# Patient Record
Sex: Male | Born: 1985 | Race: Black or African American | Hispanic: No | Marital: Single | Smoking: Current every day smoker
Health system: Southern US, Community
[De-identification: ages and names within clinical notes are randomized; demographics above are authoritative.]

## PROBLEM LIST (undated history)

## (undated) DIAGNOSIS — J45909 Unspecified asthma, uncomplicated: Secondary | ICD-10-CM

## (undated) HISTORY — PX: BELOW KNEE LEG AMPUTATION: SUR23

---

## 2013-11-25 ENCOUNTER — Emergency Department (HOSPITAL_COMMUNITY): Payer: Medicaid Other

## 2013-11-25 ENCOUNTER — Encounter (HOSPITAL_COMMUNITY): Payer: Self-pay | Admitting: Emergency Medicine

## 2013-11-25 ENCOUNTER — Emergency Department (HOSPITAL_COMMUNITY)
Admission: EM | Admit: 2013-11-25 | Discharge: 2013-11-25 | Disposition: A | Discharge status: Home / Self Care | Payer: Medicaid Other | Attending: Emergency Medicine | Admitting: Emergency Medicine

## 2013-11-25 DIAGNOSIS — Y939 Activity, unspecified: Secondary | ICD-10-CM | POA: Insufficient documentation

## 2013-11-25 DIAGNOSIS — S99929A Unspecified injury of unspecified foot, initial encounter: Secondary | ICD-10-CM | POA: Diagnosis not present

## 2013-11-25 DIAGNOSIS — M25561 Pain in right knee: Secondary | ICD-10-CM

## 2013-11-25 DIAGNOSIS — Z880 Allergy status to penicillin: Secondary | ICD-10-CM | POA: Diagnosis not present

## 2013-11-25 DIAGNOSIS — S8990XA Unspecified injury of unspecified lower leg, initial encounter: Secondary | ICD-10-CM | POA: Insufficient documentation

## 2013-11-25 DIAGNOSIS — W19XXXA Unspecified fall, initial encounter: Secondary | ICD-10-CM | POA: Insufficient documentation

## 2013-11-25 DIAGNOSIS — F17200 Nicotine dependence, unspecified, uncomplicated: Secondary | ICD-10-CM | POA: Diagnosis not present

## 2013-11-25 DIAGNOSIS — Y929 Unspecified place or not applicable: Secondary | ICD-10-CM | POA: Diagnosis not present

## 2013-11-25 DIAGNOSIS — S99919A Unspecified injury of unspecified ankle, initial encounter: Principal | ICD-10-CM

## 2013-11-25 MED ORDER — HYDROCODONE-ACETAMINOPHEN 5-325 MG PO TABS: 1.0000 | ORAL_TABLET | Freq: Four times a day (QID) | ORAL | Status: DC | PRN

## 2013-11-25 MED ORDER — ONDANSETRON 4 MG PO TBDP
8.0000 mg | ORAL_TABLET | Freq: Once | ORAL | Status: AC
  Administered 2013-11-25: 8 mg via ORAL
  Filled 2013-11-25: qty 2

## 2013-11-25 MED ORDER — HYDROCODONE-ACETAMINOPHEN 5-325 MG PO TABS
2.0000 | ORAL_TABLET | Freq: Once | ORAL | Status: AC
  Administered 2013-11-25: 2 via ORAL
  Filled 2013-11-25: qty 2

## 2013-11-25 MED ORDER — ONDANSETRON 4 MG PO TBDP: 4.0000 mg | ORAL_TABLET | Freq: Three times a day (TID) | ORAL | Status: DC | PRN

## 2013-11-25 NOTE — ED Notes (Signed)
Patient transported to X-ray 

## 2013-11-25 NOTE — ED Notes (Signed)
Pt presents to department for evaluation of R leg pain. States he fell Thursday night. States he has issues with leg "locking" in place. 8/10 pain upon arrival to ED. 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 NAD.

## 2013-11-25 NOTE — ED Provider Notes (Signed)
Medical screening examination/treatment/procedure(s) were performed by non-physician practitioner and as supervising physician I was immediately available for consultation/collaboration.   EKG Interpretation None        Courtney F Horton, MD 11/25/13 1942 

## 2013-11-25 NOTE — ED Notes (Signed)
Patient declines wheelchair at discharge.  Patient escorted to lobby by RN.  

## 2013-11-25 NOTE — ED Provider Notes (Signed)
CSN: [id number]     Arrival date & time 11/25/13  1144 History  This chart was scribed for non-physician practitioner working with Shon Baton, MD by Freida Busman, ED Scribe. This patient was seen in room TR05C/TR05C and the patient's care was started at 12:51 PM.    Chief Complaint  Patient presents with  . Leg Pain    The history is provided by the patient. No language interpreter was used.    HPI Comments:  Joseph Bond is a 28 y.o. male who presents to the Emergency Department s/p fall 4 days ago complaining of moderate pain to his right leg following the incident. He denies head injury or LOC s/p fall. Pt states his leg has been "locking" up.  He denies numbness and tingling down his RLE. He also denies  increased warmth/cold or redness to the extremity. No fever/chils. No alleviating factors noted.  Pt with a h/o a left BKA.   History reviewed. No pertinent past medical history. Past Surgical History  Procedure Laterality Date  . Below knee leg amputation     No family history on file. History  Substance Use Topics  . Smoking status: Current Every Day Smoker    Types: Cigarettes  . Smokeless tobacco: Not on file  . Alcohol Use: Yes     Comment: social    Review of Systems  Musculoskeletal: Positive for arthralgias and myalgias.  All other systems reviewed and are negative.     Allergies  Penicillins  Home Medications   Prior to Admission medications   Medication Sig Start Date End Date Taking? Authorizing Provider  HYDROcodone-acetaminophen (NORCO/VICODIN) 5-325 MG per tablet Take 1-2 tablets by mouth every 6 (six) hours as needed for severe pain. 11/25/13   Niaja Stickley L Adeana Grilliot, PA-C  ondansetron (ZOFRAN ODT) 4 MG disintegrating tablet Take 1 tablet (4 mg total) by mouth every 8 (eight) hours as needed for nausea or vomiting. 11/25/13   Victorino Dike L Veretta Sabourin, PA-C   BP 114/66  Pulse 76  Temp(Src) 97.4 F (36.3 C) (Oral)  Resp 16  SpO2 100% Physical  Exam  Nursing note and vitals reviewed. Constitutional: He is oriented to person, place, and time. He appears well-developed and well-nourished. No distress.  HENT:  Head: Normocephalic and atraumatic.  Right Ear: External ear normal.  Left Ear: External ear normal.  Nose: Nose normal.  Mouth/Throat: Oropharynx is clear and moist.  Eyes: Conjunctivae are normal.  Neck: Normal range of motion. Neck supple.  Cardiovascular: Normal rate, regular rhythm, normal heart sounds and intact distal pulses.   Pulmonary/Chest: Effort normal and breath sounds normal. No respiratory distress.  Abdominal: Soft.  Musculoskeletal:  Lower extremity ROM at baseline Patient with left BKA. Right leg thin with contractures of toes. Decreased ROM of R knee (baseline) along with bony prominence of R knee.  No erythema, warmth, effusion, bruising noted.   Neurological: He is alert and oriented to person, place, and time.  Skin: Skin is warm and dry. No rash noted. He is not diaphoretic. No erythema.  Psychiatric: He has a normal mood and affect.    ED Course  Procedures  Medications  HYDROcodone-acetaminophen (NORCO/VICODIN) 5-325 MG per tablet 2 tablet (2 tablets Oral Given 11/25/13 1300)  ondansetron (ZOFRAN-ODT) disintegrating tablet 8 mg (8 mg Oral Given 11/25/13 1436)     DIAGNOSTIC STUDIES:  Oxygen Saturation is 100% on RA, normal by my interpretation.    COORDINATION OF CARE:  12:57 PM Discussed treatment plan with pt at  bedside and pt agreed to plan. 2:53 PM Pt reassessed states he feels better at this time.  Labs Review Labs Reviewed - No data to display  Imaging Review Dg Knee Complete 4 Views Right  11/25/2013   CLINICAL DATA:  Right knee pain after fall.  EXAM: RIGHT KNEE - COMPLETE 4+ VIEW  COMPARISON:  None.  FINDINGS: No fracture or dislocation is noted. No joint effusion is noted. Severe narrowing of the medial and lateral joint spaces is noted. No soft tissue abnormality is noted.   IMPRESSION: Severe degenerative joint disease is noted. No fracture or dislocation is noted in the right knee.   Electronically Signed   By: Roque Lias M.D.   On: 11/25/2013 14:18     EKG Interpretation None      Patient with known DJD. Discussed x-ray results with him.   Patient became nauseous after pain medication, liquids, crackers and Zofran given with resolution of symptoms.  MDM   Final diagnoses:  Right knee pain    Filed Vitals:   11/25/13 1501  BP: 114/66  Pulse: 76  Temp: 97.4 F (36.3 C)  Resp: 16   Afebrile, NAD, non-toxic appearing, AAOx4.  Neurovascularly intact. Normal sensation. No evidence of compartment syndrome. Patient X-Ray negative for obvious fracture or dislocation. Pain managed in ED. Pt advised to follow up with orthopedics for evaluation of ligament or meniscal injury. Patient given brace while in ED, conservative therapy recommended and discussed. Patient will be dc home & is agreeable with above plan.     I personally performed the services described in this documentation, which was scribed in my presence. The recorded information has been reviewed and is accurate.     Jeannetta Ellis, PA-C 11/25/13 1510

## 2013-11-25 NOTE — Discharge Instructions (Signed)
Please follow up with your primary care physician in 1-2 days. If you do not have one please call the [facility] number listed above. Please follow up with Dr. Eulah Pont to schedule a follow up appointment.  Please take pain medication and/or muscle relaxants as prescribed and as needed for pain. Please do not drive on narcotic pain medication or on muscle relaxants. Please read all discharge instructions and return precautions.   Knee Pain The knee is the complex joint between your thigh and your lower leg. It is made up of bones, tendons, ligaments, and cartilage. The bones that make up the knee are:  The femur in the thigh.  The tibia and fibula in the lower leg.  The patella or kneecap riding in the groove on the lower femur. CAUSES  Knee pain is a common complaint with many causes. A few of these causes are:  Injury, such as:  A ruptured ligament or tendon injury.  Torn cartilage.  Medical conditions, such as:  Gout  Arthritis  Infections  Overuse, over training, or overdoing a physical activity. Knee pain can be minor or severe. Knee pain can accompany debilitating injury. Minor knee problems often respond well to self-care measures or get well on their own. More serious injuries may need medical intervention or even surgery. SYMPTOMS The knee is complex. Symptoms of knee problems can vary widely. Some of the problems are:  Pain with movement and weight bearing.  Swelling and tenderness.  Buckling of the knee.  Inability to straighten or extend your knee.  Your knee locks and you cannot straighten it.  Warmth and redness with pain and fever.  Deformity or dislocation of the kneecap. DIAGNOSIS  Determining what is wrong may be very straight forward such as when there is an injury. It can also be challenging because of the complexity of the knee. Tests to make a diagnosis may include:  Your caregiver taking a history and doing a physical  exam.  Routine X-rays can be used to rule out other problems. X-rays will not reveal a cartilage tear. Some injuries of the knee can be diagnosed by:  Arthroscopy a surgical technique by which a small video camera is inserted through tiny incisions on the sides of the knee. This procedure is used to examine and repair internal knee joint problems. Tiny instruments can be used during arthroscopy to repair the torn knee cartilage (meniscus).  Arthrography is a radiology technique. A contrast liquid is directly injected into the knee joint. Internal structures of the knee joint then become visible on X-ray film.  An MRI scan is a non X-ray radiology procedure in which magnetic fields and a computer produce two- or three-dimensional images of the inside of the knee. Cartilage tears are often visible using an MRI scanner. MRI scans have largely replaced arthrography in diagnosing cartilage tears of the knee.  Blood work.  Examination of the fluid that helps to lubricate the knee joint (synovial fluid). This is done by taking a sample out using a needle and a syringe. TREATMENT The treatment of knee problems depends on the cause. Some of these treatments are:  Depending on the injury, proper casting, splinting, surgery, or physical therapy care will be needed.  Give yourself adequate recovery time. Do not overuse your joints. If you begin to get sore during workout routines, back off. Slow down or do fewer repetitions.  For repetitive activities such as cycling or running, maintain your strength and nutrition.  Alternate  muscle groups. For example, if you are a weight lifter, work the upper body on one day and the lower body the next. °· Either tight or weak muscles do not give the proper support for your knee. Tight or weak muscles do not absorb the stress placed on the knee joint. Keep the muscles surrounding the knee strong. °· Take care of mechanical problems. °¨ If you have flat feet, orthotics  or special shoes may help. See your caregiver if you need help. °¨ Arch supports, sometimes with wedges on the inner or outer aspect of the heel, can help. These can shift pressure away from the side of the knee most bothered by osteoarthritis. °¨ A brace called an "unloader" brace also may be used to help ease the pressure on the most arthritic side of the knee. °· If your caregiver has prescribed crutches, braces, wraps or ice, use as directed. The acronym for this is PRICE. This means protection, rest, ice, compression, and elevation. °· Nonsteroidal anti-inflammatory drugs (NSAIDs), can help relieve pain. But if taken immediately after an injury, they may actually increase swelling. Take NSAIDs with food in your stomach. Stop them if you develop stomach problems. Do not take these if you have a history of ulcers, stomach pain, or bleeding from the bowel. Do not take without your caregiver's approval if you have problems with fluid retention, heart failure, or kidney problems. °· For ongoing knee problems, physical therapy may be helpful. °· Glucosamine and chondroitin are over-the-counter dietary supplements. Both may help relieve the pain of osteoarthritis in the knee. These medicines are different from the usual anti-inflammatory drugs. Glucosamine may decrease the rate of cartilage destruction. °· Injections of a corticosteroid drug into your knee joint may help reduce the symptoms of an arthritis flare-up. They may provide pain relief that lasts a few months. You may have to wait a few months between injections. The injections do have a small increased risk of infection, water retention, and elevated blood sugar levels. °· Hyaluronic acid injected into damaged joints may ease pain and provide lubrication. These injections may work by reducing inflammation. A series of shots may give relief for as long as 6 months. °· Topical painkillers. Applying certain ointments to your skin may help relieve the pain and  stiffness of osteoarthritis. Ask your pharmacist for suggestions. Many over the-counter products are approved for temporary relief of arthritis pain. °· In some countries, doctors often prescribe topical NSAIDs for relief of chronic conditions such as arthritis and tendinitis. A review of treatment with NSAID creams found that they worked as well as oral medications but without the serious side effects. °PREVENTION °· Maintain a healthy weight. Extra pounds put more strain on your joints. °· Get strong, stay limber. Weak muscles are a common cause of knee injuries. Stretching is important. Include flexibility exercises in your workouts. °· Be smart about exercise. If you have osteoarthritis, chronic knee pain or recurring injuries, you may need to change the way you exercise. This does not mean you have to stop being active. If your knees ache after jogging or playing basketball, consider switching to swimming, water aerobics, or other low-impact activities, at least for a few days a week. Sometimes limiting high-impact activities will provide relief. °· Make sure your shoes fit well. Choose footwear that is right for your sport. °· Protect your knees. Use the proper gear for knee-sensitive activities. Use kneepads when playing volleyball or laying carpet. Buckle your seat belt every time you drive.   Most shattered kneecaps occur in car accidents.  Rest when you are tired. SEEK MEDICAL CARE IF:  You have knee pain that is continual and does not seem to be getting better.  SEEK IMMEDIATE MEDICAL CARE IF:  Your knee joint feels hot to the touch and you have a high fever. MAKE SURE YOU:   Understand these instructions.  Will watch your condition.  Will get help right away if you are not doing well or get worse. Document Released: 12/13/2006 Document Revised: 05/10/2011 Document Reviewed: 12/13/2006 [facility] Patient Information 2015 [place], [place]. This information is not intended to replace advice given  to you by your health care provider. Make sure you discuss any questions you have with your health care provider.

## 2013-11-25 NOTE — ED Notes (Signed)
From out of town; Lorcet, ultram, percocet help with pain

## 2016-02-19 ENCOUNTER — Ambulatory Visit: Payer: Medicaid Other | Attending: Family Medicine | Admitting: Family Medicine

## 2016-02-19 ENCOUNTER — Encounter: Payer: Self-pay | Admitting: Family Medicine

## 2016-02-19 ENCOUNTER — Encounter (HOSPITAL_COMMUNITY): Payer: Self-pay

## 2016-02-19 DIAGNOSIS — F909 Attention-deficit hyperactivity disorder, unspecified type: Secondary | ICD-10-CM | POA: Diagnosis not present

## 2016-02-19 DIAGNOSIS — M1710 Unilateral primary osteoarthritis, unspecified knee: Secondary | ICD-10-CM

## 2016-02-19 DIAGNOSIS — Z0001 Encounter for general adult medical examination with abnormal findings: Secondary | ICD-10-CM | POA: Insufficient documentation

## 2016-02-19 DIAGNOSIS — Z880 Allergy status to penicillin: Secondary | ICD-10-CM | POA: Diagnosis not present

## 2016-02-19 DIAGNOSIS — Z89512 Acquired absence of left leg below knee: Secondary | ICD-10-CM | POA: Diagnosis not present

## 2016-02-19 DIAGNOSIS — X58XXXA Exposure to other specified factors, initial encounter: Secondary | ICD-10-CM | POA: Diagnosis not present

## 2016-02-19 DIAGNOSIS — S83002A Unspecified subluxation of left patella, initial encounter: Secondary | ICD-10-CM | POA: Diagnosis not present

## 2016-02-19 DIAGNOSIS — Z89519 Acquired absence of unspecified leg below knee: Secondary | ICD-10-CM | POA: Insufficient documentation

## 2016-02-19 DIAGNOSIS — F988 Other specified behavioral and emotional disorders with onset usually occurring in childhood and adolescence: Secondary | ICD-10-CM | POA: Insufficient documentation

## 2016-02-19 NOTE — Progress Notes (Signed)
Had to walk to get here so right leg aches now

## 2016-02-19 NOTE — Progress Notes (Signed)
Subjective:  Patient ID: Joseph Bond, male    DOB: Jul 08, 1985  Age: 30 y.o. MRN: [id number]  CC: Referral (prosthesis left side-below knee amputation, right leg doesn't bend) and referral to psychiatry (would like to start college and wants to go back on ADD meds)   HPI Joseph Bond is a 30 year old male patient who presents today to get established with the clinic. Medical history significant for congenital absence of the left leg, ADD. He would like to obtain a new left leg prosthesis as his current one is broken. He also requests a referral to psych as he would like to get back on his ADD medications since he will be going to college.  He has pain in his right leg and left stump as he had to walk a long distance here. Currently does not take any medications.  History reviewed. No pertinent past medical history.  Past Surgical History:  Procedure Laterality Date  . BELOW KNEE LEG AMPUTATION      Allergies  Allergen Reactions  . Penicillins       Outpatient Medications Prior to Visit  Medication Sig Dispense Refill  . HYDROcodone-acetaminophen (NORCO/VICODIN) 5-325 MG per tablet Take 1-2 tablets by mouth every 6 (six) hours as needed for severe pain. 15 tablet 0  . ondansetron (ZOFRAN ODT) 4 MG disintegrating tablet Take 1 tablet (4 mg total) by mouth every 8 (eight) hours as needed for nausea or vomiting. 20 tablet 0   No facility-administered medications prior to visit.     ROS Review of Systems  Constitutional: Negative for activity change and appetite change.  HENT: Negative for sinus pressure and sore throat.   Eyes: Negative for visual disturbance.  Respiratory: Negative for cough, chest tightness and shortness of breath.   Cardiovascular: Negative for chest pain and leg swelling.  Gastrointestinal: Negative for abdominal distention, abdominal pain, constipation and diarrhea.  Endocrine: Negative.   Genitourinary: Negative for dysuria.  Musculoskeletal:       See  hpi  Skin: Negative for rash.  Allergic/Immunologic: Negative.   Neurological: Negative for weakness, light-headedness and numbness.  Psychiatric/Behavioral: Negative for dysphoric mood and suicidal ideas.    Objective:  BP 106/66 (BP Location: Right Arm, Patient Position: Sitting, Cuff Size: Small)   Pulse 88   Temp 98.2 F (36.8 C) (Oral)   Ht 5\' 6"  (1.676 m)   Wt 123 lb 9.6 oz (56.1 kg)   SpO2 100%   BMI 19.95 kg/m   BP/Weight 02/19/2016 11/25/2013  Systolic BP 106 114  Diastolic BP 66 66  Wt. (Lbs) 123.6 -  BMI 19.95 -      Physical Exam  Constitutional: He is oriented to person, place, and time. He appears well-developed and well-nourished.  Cardiovascular: Normal rate, normal heart sounds and intact distal pulses.   No murmur heard. Pulmonary/Chest: Effort normal and breath sounds normal. He has no wheezes. He has no rales. He exhibits no tenderness.  Abdominal: Soft. Bowel sounds are normal. He exhibits no distension and no mass. There is no tenderness.  Musculoskeletal:  Right lower extremity: Inability to flex at the knee Left lower extremity: Left stump with malformed left knee.  Neurological: He is alert and oriented to person, place, and time.     Assessment & Plan:   1. Subluxation of patella, acquired, left, initial encounter stable  2. Arthrosis of knee He does not take painmedications  3. 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   Follow-up: Return in about 1 month (around 03/21/2016) for coordination of care.   Jaclyn ShaggyEnobong Amao MD

## 2016-04-21 ENCOUNTER — Ambulatory Visit (HOSPITAL_COMMUNITY): Payer: Self-pay | Admitting: Psychiatry

## 2016-08-11 ENCOUNTER — Telehealth: Payer: Self-pay | Admitting: Family Medicine

## 2016-08-11 NOTE — Telephone Encounter (Signed)
Gm  Dr Venetia NightAmao patient is calling he need a  rx for his prostate leg to sent to [facility] he had problems with his medicaid before ,  Thank you

## 2016-08-13 ENCOUNTER — Ambulatory Visit: Payer: Self-pay | Admitting: Family Medicine

## 2016-08-13 NOTE — Telephone Encounter (Signed)
Will forward to pcp

## 2016-08-16 NOTE — Telephone Encounter (Signed)
Faxed to [facility] 

## 2016-08-23 ENCOUNTER — Telehealth: Payer: Self-pay | Admitting: Family Medicine

## 2016-08-23 NOTE — Telephone Encounter (Signed)
Medical records received a request for records from [facility], PT need a scrip for a Left Leg Prothesis x1 Dx Absence of left lower leg, they inform me that PT need to have an appt with us to get scrip  since the last time was see by hid PCP was 12/17, I try to call the pt, unable to reach him since is not voice mail and no one answer the call...Marland Kitchen..Marland Kitchen

## 2017-12-12 ENCOUNTER — Encounter: Payer: Self-pay | Admitting: Family Medicine

## 2017-12-12 ENCOUNTER — Ambulatory Visit: Payer: Medicaid Other | Attending: Family Medicine | Admitting: Family Medicine

## 2017-12-12 ENCOUNTER — Ambulatory Visit (HOSPITAL_BASED_OUTPATIENT_CLINIC_OR_DEPARTMENT_OTHER): Payer: Medicaid Other | Admitting: Licensed Clinical Social Worker

## 2017-12-12 VITALS — BP 118/70 | HR 90 | Temp 98.1°F | Wt 123.0 lb

## 2017-12-12 DIAGNOSIS — Z89512 Acquired absence of left leg below knee: Secondary | ICD-10-CM | POA: Diagnosis not present

## 2017-12-12 DIAGNOSIS — Z79899 Other long term (current) drug therapy: Secondary | ICD-10-CM | POA: Insufficient documentation

## 2017-12-12 DIAGNOSIS — F988 Other specified behavioral and emotional disorders with onset usually occurring in childhood and adolescence: Secondary | ICD-10-CM

## 2017-12-12 DIAGNOSIS — Z880 Allergy status to penicillin: Secondary | ICD-10-CM | POA: Insufficient documentation

## 2017-12-12 NOTE — Patient Instructions (Signed)

## 2017-12-12 NOTE — Progress Notes (Signed)
Patient needs new prothesis for right leg  Patient would liek to discuss ADHD medication.

## 2017-12-12 NOTE — Progress Notes (Signed)
Subjective:  Patient ID: Joseph Bond, male    DOB: 03-Feb-1986  Age: 32 y.o. MRN: [id number]  CC: Establish Care   HPI Joseph Bond is a 32 year old male patient who presents today for follow-up visit.  Medical history is significant for congenital absence of the left leg, ADD. He is requesting a prescription for a left leg prosthesis and I had written him one 2 years ago however his Medicaid ran out and he moved to [place] but is back to [place] now and is requesting a new prescription. He also needs medications for ADD as he last took medications 2 years ago and will be returning to college. Seen at [facility] where he underwent therapy session and was informed he needed to see his PCP for prescription.  Prior to that he had received Remeron and Cymbalta from a psychiatry practice in town pending his appointment with [facility]. He has no additional concerns today.  History reviewed. No pertinent past medical history.  Past Surgical History:  Procedure Laterality Date  . BELOW KNEE LEG AMPUTATION      Allergies  Allergen Reactions  . Penicillins      Outpatient Medications Prior to Visit  Medication Sig Dispense Refill  . DULoxetine (CYMBALTA) 20 MG capsule TK ONE C PO  BID  1  . mirtazapine (REMERON) 15 MG tablet Take 15 mg by mouth at bedtime. Take one half tablet at bedtime     No facility-administered medications prior to visit.     ROS Review of Systems  Constitutional: Negative for activity change and appetite change.  HENT: Negative for sinus pressure and sore throat.   Eyes: Negative for visual disturbance.  Respiratory: Negative for cough, chest tightness and shortness of breath.   Cardiovascular: Negative for chest pain and leg swelling.  Gastrointestinal: Negative for abdominal distention, abdominal pain, constipation and diarrhea.  Endocrine: Negative.   Genitourinary: Negative for dysuria.  Musculoskeletal: Negative for joint swelling and myalgias.    Skin: Negative for rash.  Allergic/Immunologic: Negative.   Neurological: Negative for weakness, light-headedness and numbness.  Psychiatric/Behavioral: Negative for dysphoric mood and suicidal ideas.    Objective:  BP 118/70   Pulse 90   Temp 98.1 F (36.7 C) (Oral)   Wt 123 lb (55.8 kg)   SpO2 100%   BMI 19.85 kg/m   BP/Weight 12/12/2017 02/19/2016 11/25/2013  Systolic BP 118 106 114  Diastolic BP 70 66 66  Wt. (Lbs) 123 123.6 -  BMI 19.85 19.95 -      Physical Exam  Constitutional: He is oriented to person, place, and time. He appears well-developed and well-nourished.  Cardiovascular: Normal rate and normal heart sounds.  No murmur heard. Pulmonary/Chest: Effort normal and breath sounds normal. He has no wheezes. He has no rales. He exhibits no tenderness.  Abdominal: Soft. Bowel sounds are normal. He exhibits no distension and no mass. There is no tenderness.  Musculoskeletal:  Left prosthetic leg  Neurological: He is alert and oriented to person, place, and time.     Assessment & Plan:   1. 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 Attention deficit disorder (ADD) without hyperactivity He has been off medications for the last 2 weeks Seen at [facility] but apparently did not receive prescriptions and was told to follow-up with his primary care physician - Ambulatory referral to Psychiatry   No orders of the defined types were placed in this encounter.  Follow-up: Return in about 6 months (around 06/13/2018) for Follow-up of chronic medical conditions.   Hoy Register MD

## 2017-12-13 LAB — BASIC METABOLIC PANEL
BUN/Creatinine Ratio: 12 (ref 9–20)
BUN: 12 mg/dL (ref 6–20)
CALCIUM: 9.8 mg/dL (ref 8.7–10.2)
CHLORIDE: 102 mmol/L (ref 96–106)
CO2: 23 mmol/L (ref 20–29)
CREATININE: 1.04 mg/dL (ref 0.76–1.27)
GFR calc Af Amer: 110 mL/min/{1.73_m2} (ref >59)
GFR calc non Af Amer: 95 mL/min/{1.73_m2} (ref >59)
GLUCOSE: 76 mg/dL (ref 65–99)
Potassium: 4.7 mmol/L (ref 3.5–5.2)
Sodium: 145 mmol/L — ABNORMAL HIGH (ref 134–144)

## 2017-12-13 NOTE — BH Specialist Note (Signed)
Integrated Behavioral Health Initial Visit  MRN: [id number] Name: Joseph Bond  Number of Integrated Behavioral Health Clinician visits:: 1/6 Session Start time: 3:00 PM  Session End time: 3:45 PM Total time: 45 minutes  Type of Service: Integrated Behavioral Health- Individual/Family Interpretor:No. Interpretor Name and Language: N/A   Warm Hand Off Completed.       SUBJECTIVE: Joseph Bond is a 32 y.o. male accompanied by self Patient was referred by Dr. Alvis Lemmings for behavioral health referral. Patient reports the following symptoms/concerns: Pt reports that he recently relocated to [facility] from [place]. He is in need of transportation resources, in addition, to medication management to assist with ADD Duration of problem: na; Severity of problem: na  OBJECTIVE: Mood: Appropriate and Affect: Appropriate Risk of harm to self or others: No plan to harm self or others  LIFE CONTEXT: Family and Social: Pt currently resides with his God-sister who is a strong support for him. School/Work: Pt plans on returning to school in the near future. He receives disability [phone]) Self-Care: Pt has hx of substance use (alcohol and cocaine) He has been sober for a couple of months and is not interested in substance use resources Life Changes: Pt recently relocated to [place] from [place]. He has remained sober from substances and plans to return to school.   GOALS ADDRESSED: Patient will: 1. Reduce symptoms of: stress 2. Increase knowledge and/or ability of: coping skills  3. Demonstrate ability to: Increase adequate support systems for patient/family  INTERVENTIONS: Interventions utilized: Solution-Focused Strategies  Standardized Assessments completed: GAD-7 and PHQ 2&9  ASSESSMENT: Patient currently experiencing stress triggered by recent move to [place] and limited resources. He is in need of transportation resources, in addition, to medication management to assist with ADD. Pt receives strong support from family.  Denies hx of SI/HI.    Patient receives psychotherapy through [facility] with Tiffany Kocher. He completed intake with [facility] on September 24, 19 to receive ADD medication;however, was instructed to address needs with PCP. Pt agreed to referral to [facility] for medication management.  LCSWA commended pt on his sobriety and decision to return to school. Pt was informed of local transportation resources ([place], Medicaid, and bus passes) and food insecurity.   PLAN: 1. Follow up with behavioral health clinician on : Pt was encouraged to contact LCSWA if symptoms worsen or fail to improve to schedule behavioral appointments at [facility]. 2. Behavioral recommendations: LCSWA recommends that pt apply healthy coping skills discussed and follow up with provided resources.  3. Referral(s): Integrated Art gallery manager (In Clinic), Community Resources:  Engineer, water and [facility] 4. "From scale of 1-10, how likely are you to follow plan?":   Bridgett Larsson, LCSW 12/15/17 7:35 AM

## 2017-12-16 ENCOUNTER — Telehealth: Payer: Self-pay

## 2017-12-16 ENCOUNTER — Telehealth: Payer: Self-pay | Admitting: Licensed Clinical Social Worker

## 2017-12-16 NOTE — Telephone Encounter (Signed)
Patient was called and informed to contact office for lab results.    If patient returns phone call please inform patient of lab results below. 

## 2017-12-16 NOTE — Telephone Encounter (Signed)
A completed referral to [facility] was faxed to [phone] 

## 2017-12-16 NOTE — Telephone Encounter (Signed)
-----   Message from Hoy Register, MD sent at 12/13/2017  1:36 PM EDT ----- Labs are stable

## 2018-03-17 ENCOUNTER — Ambulatory Visit: Payer: Self-pay | Admitting: Nurse Practitioner

## 2018-04-29 ENCOUNTER — Emergency Department (HOSPITAL_COMMUNITY): Payer: Medicaid Other

## 2018-04-29 ENCOUNTER — Encounter (HOSPITAL_COMMUNITY): Payer: Self-pay

## 2018-04-29 ENCOUNTER — Emergency Department (HOSPITAL_COMMUNITY)
Admission: EM | Admit: 2018-04-29 | Discharge: 2018-04-29 | Disposition: A | Discharge status: Home / Self Care | Payer: Medicaid Other | Attending: Emergency Medicine | Admitting: Emergency Medicine

## 2018-04-29 DIAGNOSIS — F17210 Nicotine dependence, cigarettes, uncomplicated: Secondary | ICD-10-CM | POA: Diagnosis not present

## 2018-04-29 DIAGNOSIS — F17228 Nicotine dependence, chewing tobacco, with other nicotine-induced disorders: Secondary | ICD-10-CM | POA: Insufficient documentation

## 2018-04-29 DIAGNOSIS — R0789 Other chest pain: Secondary | ICD-10-CM | POA: Diagnosis present

## 2018-04-29 DIAGNOSIS — R079 Chest pain, unspecified: Secondary | ICD-10-CM

## 2018-04-29 DIAGNOSIS — Z789 Other specified health status: Secondary | ICD-10-CM

## 2018-04-29 DIAGNOSIS — Z7289 Other problems related to lifestyle: Secondary | ICD-10-CM | POA: Diagnosis not present

## 2018-04-29 DIAGNOSIS — J45909 Unspecified asthma, uncomplicated: Secondary | ICD-10-CM | POA: Insufficient documentation

## 2018-04-29 DIAGNOSIS — F1010 Alcohol abuse, uncomplicated: Secondary | ICD-10-CM | POA: Diagnosis not present

## 2018-04-29 DIAGNOSIS — Z79899 Other long term (current) drug therapy: Secondary | ICD-10-CM | POA: Diagnosis not present

## 2018-04-29 DIAGNOSIS — F1490 Cocaine use, unspecified, uncomplicated: Secondary | ICD-10-CM | POA: Insufficient documentation

## 2018-04-29 HISTORY — DX: Unspecified asthma, uncomplicated: J45.909

## 2018-04-29 LAB — COMPREHENSIVE METABOLIC PANEL
ALK PHOS: 50 U/L (ref 38–126)
ALT: 25 U/L (ref 0–44)
ANION GAP: 15 (ref 5–15)
AST: 22 U/L (ref 15–41)
Albumin: 4.6 g/dL (ref 3.5–5.0)
BILIRUBIN TOTAL: 0.6 mg/dL (ref 0.3–1.2)
BUN: 12 mg/dL (ref 6–20)
CALCIUM: 9.1 mg/dL (ref 8.9–10.3)
CO2: 17 mmol/L — ABNORMAL LOW (ref 22–32)
CREATININE: 0.95 mg/dL (ref 0.61–1.24)
Chloride: 110 mmol/L (ref 98–111)
GFR calc Af Amer: >60 mL/min (ref >60)
GFR calc non Af Amer: >60 mL/min (ref >60)
GLUCOSE: 76 mg/dL (ref 70–99)
Potassium: 3 mmol/L — ABNORMAL LOW (ref 3.5–5.1)
Sodium: 142 mmol/L (ref 135–145)
Total Protein: 7.6 g/dL (ref 6.5–8.1)

## 2018-04-29 LAB — CBC
HEMATOCRIT: 50.9 % (ref 39.0–52.0)
Hemoglobin: 16.5 g/dL (ref 13.0–17.0)
MCH: 31 pg (ref 26.0–34.0)
MCHC: 32.4 g/dL (ref 30.0–36.0)
MCV: 95.7 fL (ref 80.0–100.0)
Platelets: 298 10*3/uL (ref 150–400)
RBC: 5.32 MIL/uL (ref 4.22–5.81)
RDW: 12.6 % (ref 11.5–15.5)
WBC: 7.4 10*3/uL (ref 4.0–10.5)
nRBC: 0 % (ref 0.0–0.2)

## 2018-04-29 LAB — I-STAT TROPONIN, ED: Troponin i, poc: 0 ng/mL (ref 0.00–0.08)

## 2018-04-29 LAB — LIPASE, BLOOD: Lipase: 34 U/L (ref 11–51)

## 2018-04-29 MED ORDER — SODIUM CHLORIDE 0.9 % IV BOLUS
1000.0000 mL | Freq: Once | INTRAVENOUS | Status: AC
  Administered 2018-04-29: 1000 mL via INTRAVENOUS

## 2018-04-29 NOTE — ED Notes (Signed)
Bed: TH43 Expected date:  Expected time:  Means of arrival:  Comments: 30s Chest Pain

## 2018-04-29 NOTE — ED Provider Notes (Signed)
[facility]-EMERGENCY DEPT Provider Note  CSN: [id number] Arrival date & time: 04/29/18 0459  Chief Complaint(s) Chest Pain  HPI Joseph Bond is a 33 y.o. male with a history of congenital deformities and polysubstance abuse who presents to the emergency department with chest discomfort described as rapid "thumping" that began approximately 2 hours ago.  This is in the setting of cocaine and heavy alcohol consumption.  Patient reports that he had similar episode 4 days ago after using cocaine and drinking alcohol which resolved spontaneously 5 hours after initial onset.  Patient states that the discomfort has improved.  It is nonradiating and nonexertional.  He has no associated shortness of breath.  No recent fevers or infections.  No nausea vomiting.  No abdominal pain.  Patient endorses chronic back pain without acute exacerbation.  HPI  Past Medical History Past Medical History:  Diagnosis Date  . Asthma    childhood asthma   Patient Active Problem List   Diagnosis Date Noted  . Subluxation of patella, acquired, left, initial encounter 02/19/2016  . 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 ADD (attention deficit disorder) 02/19/2016   Home Medication(s) Prior to Admission medications   Medication Sig Start Date End Date Taking? Authorizing Provider  DULoxetine (CYMBALTA) 20 MG capsule TK ONE C PO  BID 11/25/17   [provider]  mirtazapine (REMERON) 15 MG tablet Take 15 mg by mouth at bedtime. Take one half tablet at bedtime    [provider]                                                                                                                                    Past Surgical History Past Surgical History:  Procedure Laterality Date  . BELOW KNEE LEG AMPUTATION     Family History History reviewed. No pertinent family history.  Social History Social History   Tobacco Use  . Smoking  status: Current Every Day Smoker    Packs/day: 0.50    Types: Cigarettes  . Smokeless tobacco: Current User  Substance Use Topics  . Alcohol use: Yes    Alcohol/week: 3.0 standard drinks    Types: 1 Glasses of wine, 1 Cans of beer, 1 Shots of liquor per week    Comment: social  . Drug use: Yes    Types: "Crack" cocaine, Cocaine   Allergies Penicillins  Review of Systems Review of Systems All other systems are reviewed and are negative for acute change except as noted in the HPI  Physical Exam Vital Signs  I have reviewed the triage vital signs BP 102/63   Pulse 100   Temp 97.7 F (36.5 C) (Oral)   Resp 16   SpO2 97%   Physical Exam Vitals signs reviewed.  Constitutional:      General: He is not in acute distress.    Appearance: He is  well-developed. He is not diaphoretic.  HENT:     Head: Normocephalic and atraumatic.     Nose: Nose normal.  Eyes:     General: No scleral icterus.       Right eye: No discharge.        Left eye: No discharge.     Conjunctiva/sclera: Conjunctivae normal.     Pupils: Pupils are equal, round, and reactive to light.  Neck:     Musculoskeletal: Normal range of motion and neck supple.  Cardiovascular:     Rate and Rhythm: Regular rhythm. Tachycardia present.     Heart sounds: No murmur. No friction rub. No gallop.   Pulmonary:     Effort: Pulmonary effort is normal. No respiratory distress.     Breath sounds: Normal breath sounds. No stridor. No rales.  Abdominal:     General: There is no distension.     Palpations: Abdomen is soft.     Tenderness: There is no abdominal tenderness. There is no guarding or rebound.  Musculoskeletal:     Thoracic back: He exhibits tenderness and spasm. He exhibits no bony tenderness.     Lumbar back: He exhibits tenderness and spasm. He exhibits no bony tenderness.       Back:       Legs:  Skin:    General: Skin is warm and dry.     Findings: No erythema or rash.  Neurological:     Mental  Status: He is alert and oriented to person, place, and time.     ED Results and Treatments Labs (all labs ordered are listed, but only abnormal results are displayed) Labs Reviewed  COMPREHENSIVE METABOLIC PANEL - Abnormal; Notable for the following components:      Result Value   Potassium 3.0 (*)    CO2 17 (*)    All other components within normal limits  CBC  LIPASE, BLOOD  I-STAT TROPONIN, ED                                                                                                                         EKG  EKG Interpretation  Date/Time:  Saturday April 29 2018 05:10:03 EST Ventricular Rate:  109 PR Interval:    QRS Duration: 85 QT Interval:  326 QTC Calculation: 439 R Axis:   79 Text Interpretation:  Sinus tachycardia Biatrial enlargement RSR' in V1 or V2, probably normal variant NO STEMI. Confirmed by Drema Pry [phone]) on 04/29/2018 5:45:23 AM      Radiology Dg Chest 2 View  Result Date: 04/29/2018 CLINICAL DATA:  33 year old male with chest pain. EXAM: CHEST - 2 VIEW COMPARISON:  None. FINDINGS: The heart size and mediastinal contours are within normal limits. Both lungs are clear. The visualized skeletal structures are unremarkable. IMPRESSION: No active cardiopulmonary disease. Electronically Signed   By: Elgie Collard M.D.   On: 04/29/2018 05:49   Pertinent labs & imaging results that were available during my care of the  patient were reviewed by me and considered in my medical decision making (see chart for details).  Medications Ordered in ED Medications  sodium chloride 0.9 % bolus 1,000 mL (0 mLs Intravenous Stopped 04/29/18 0749)                                                                                                                                    Procedures Procedures  (including critical care time)  Medical Decision Making / ED Course I have reviewed the nursing notes for this encounter and the patient's prior records (if  available in EHR or on provided paperwork).    Patient presents with atypical chest pain in the setting of cocaine and alcohol use.  EKG without acute ischemic changes or evidence of pericarditis.  Initial troponin negative.  We will continue to monitor the patient.  Will provide with IV fluids.  Low suspicion for pulmonary embolism.  Presentation not classic for aortic dissection or esophageal perforation.  Chest x-ray without evidence suggestive of pneumonia, pneumothorax, pneumomediastinum.  No abnormal contour of the mediastinum to suggest dissection. No evidence of acute injuries.  Patient's lower back pain is chronic without acute exacerbation.  No intervention needed.  7:53 AM On reassessment patient reports complete resolution of his pain.  Tachycardia has resolved. Does not appear to be vasospasm from cocaine use. Appears to be related to tachycardia.  The patient appears reasonably screened and/or stabilized for discharge and I doubt any other medical condition or other [facility] requiring further screening, evaluation, or treatment in the ED at this time prior to discharge.  The patient is safe for discharge with strict return precautions.   Final Clinical Impression(s) / ED Diagnoses Final diagnoses:  Chest pain  Cocaine use  Alcohol use   Disposition: Discharge  Condition: Good  I have discussed the results, Dx and Tx plan with the patient who expressed understanding and agree(s) with the plan. Discharge instructions discussed at great length. The patient was given strict return precautions who verbalized understanding of the instructions. No further questions at time of discharge.    ED Discharge Orders    None       Follow Up: Hoy Register, MD [address] [place] [address] [phone]  Schedule an appointment as soon as possible for a visit  As needed      This chart was dictated using voice recognition software.  Despite best efforts to  proofread,  errors can occur which can change the documentation meaning.   Nira Conn, MD 04/29/18 [phone]

## 2018-04-29 NOTE — ED Triage Notes (Addendum)
Pt BIB [facility] p using cocaine since 9pm last night. Also ETOH. States he quit using cocaine at 2am. CP radiates from his stomach to his back and pain is 12/10 at this time.

## 2018-09-21 ENCOUNTER — Other Ambulatory Visit: Payer: Self-pay

## 2018-09-21 ENCOUNTER — Emergency Department (HOSPITAL_COMMUNITY): Payer: Medicaid Other

## 2018-09-21 ENCOUNTER — Encounter (HOSPITAL_COMMUNITY): Payer: Self-pay | Admitting: *Deleted

## 2018-09-21 ENCOUNTER — Emergency Department (HOSPITAL_COMMUNITY)
Admission: EM | Admit: 2018-09-21 | Discharge: 2018-09-21 | Disposition: A | Discharge status: Home / Self Care | Payer: Medicaid Other | Attending: Emergency Medicine | Admitting: Emergency Medicine

## 2018-09-21 DIAGNOSIS — Z89512 Acquired absence of left leg below knee: Secondary | ICD-10-CM | POA: Diagnosis not present

## 2018-09-21 DIAGNOSIS — M25561 Pain in right knee: Secondary | ICD-10-CM | POA: Diagnosis not present

## 2018-09-21 DIAGNOSIS — W010XXA Fall on same level from slipping, tripping and stumbling without subsequent striking against object, initial encounter: Secondary | ICD-10-CM | POA: Diagnosis not present

## 2018-09-21 DIAGNOSIS — Y92512 Supermarket, store or market as the place of occurrence of the external cause: Secondary | ICD-10-CM | POA: Insufficient documentation

## 2018-09-21 DIAGNOSIS — F17210 Nicotine dependence, cigarettes, uncomplicated: Secondary | ICD-10-CM | POA: Insufficient documentation

## 2018-09-21 DIAGNOSIS — J45909 Unspecified asthma, uncomplicated: Secondary | ICD-10-CM | POA: Diagnosis not present

## 2018-09-21 DIAGNOSIS — Z79899 Other long term (current) drug therapy: Secondary | ICD-10-CM | POA: Diagnosis not present

## 2018-09-21 DIAGNOSIS — M545 Low back pain, unspecified: Secondary | ICD-10-CM

## 2018-09-21 DIAGNOSIS — W19XXXA Unspecified fall, initial encounter: Secondary | ICD-10-CM

## 2018-09-21 DIAGNOSIS — M25551 Pain in right hip: Secondary | ICD-10-CM | POA: Insufficient documentation

## 2018-09-21 DIAGNOSIS — Y9302 Activity, running: Secondary | ICD-10-CM | POA: Diagnosis not present

## 2018-09-21 DIAGNOSIS — Y999 Unspecified external cause status: Secondary | ICD-10-CM | POA: Insufficient documentation

## 2018-09-21 MED ORDER — ACETAMINOPHEN 500 MG PO TABS
1000.0000 mg | ORAL_TABLET | Freq: Once | ORAL | Status: AC
  Administered 2018-09-21: 1000 mg via ORAL
  Filled 2018-09-21: qty 2

## 2018-09-21 MED ORDER — LIDOCAINE 5 % EX PTCH
1.0000 | MEDICATED_PATCH | CUTANEOUS | Status: DC
  Administered 2018-09-21: 21:00:00 1 via TRANSDERMAL
  Filled 2018-09-21: qty 1

## 2018-09-21 MED ORDER — LIDOCAINE 5 % EX PTCH: 1.0000 | MEDICATED_PATCH | CUTANEOUS | 0 refills | Status: DC

## 2018-09-21 NOTE — ED Notes (Signed)
Discharge instructions discussed with pt. Pt verbalized understanding. Pt ambulatory independently. Pt to go home in cab.

## 2018-09-21 NOTE — ED Provider Notes (Signed)
[facility]   CSN: [id number] Arrival date & time: 09/21/18  1728   History   Chief Complaint Fall   HPI Annett GulaJavon Shores is a 33 y.o. male with past medical history significant for left lower extremity BKA who presents for evaluation of fall.  [facility] [employer] [facility] [place] [facility] when he fell.  Patient states he fell flat on his back.  Patient with midline back pain as well as right hip pain and knee pain since the fall.  Patient states he was ambulatory however has had pain.  Patient states he has difficulty with flexion of his right knee.  Describes his back pain as spasms and aching.  Pain worse when he ambulates.  He denies hitting head, LOC or anticoagulation.  He is not take anything for his pain.  He rates his current pain a 6/10.  Denies radiation of pain.  Denies fever, chills, nausea, vomiting, abdominal pain, neck pain, neck stiffness, unilateral weakness, numbness or tingling to his extremitie, redness, swelling, warmth to extremities, pelvic pain, IV drug use, bowel or bladder incontinence, saddle paresthesia, chronic steroid or malignancy.  History obtained from patient and past medical records.  No interpreter was used.     HPI  Past Medical History:  Diagnosis Date  . Asthma    childhood asthma    Patient Active Problem List   Diagnosis Date Noted  . Subluxation of patella, acquired, left, initial encounter 02/19/2016  . 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 ADD (attention deficit disorder) 02/19/2016    Past Surgical History:  Procedure Laterality Date  . BELOW KNEE LEG AMPUTATION          Home Medications    Prior to Admission medications   Medication Sig Start Date End Date Taking? Authorizing Provider  DULoxetine (CYMBALTA) 20 MG capsule TK ONE C PO  BID 11/25/17   [provider]  lidocaine (LIDODERM) 5 % Place 1 patch onto  the skin daily. Remove & Discard patch within 12 hours or as directed by MD 09/21/18   Henderly, Britni A, PA-C  mirtazapine (REMERON) 15 MG tablet Take 15 mg by mouth at bedtime. Take one half tablet at bedtime    [provider]    Family History No family history on file.  Social History Social History   Tobacco Use  . Smoking status: Current Every Day Smoker    Packs/day: 0.50    Types: Cigarettes  . Smokeless tobacco: Current User  Substance Use Topics  . Alcohol use: Yes    Alcohol/week: 3.0 standard drinks    Types: 1 Glasses of wine, 1 Cans of beer, 1 Shots of liquor per week    Comment: social  . Drug use: Yes    Types: "Crack" cocaine, Cocaine     Allergies   Penicillins   Review of Systems Review of Systems  Constitutional: Negative.   HENT: Negative.   Respiratory: Negative.   Cardiovascular: Negative.   Gastrointestinal: Negative.   Genitourinary: Negative.   Musculoskeletal: Positive for back pain. Negative for gait problem.       Right sided lower back pain, right knee pain.  Skin: Negative.   Neurological: Negative.   All other systems reviewed and are negative.    Physical Exam Updated Vital Signs BP 122/76   Pulse 89   Temp 98.7 F (37.1 C) (Oral)   Resp  17   Ht 5\' 6"  (1.676 m)   Wt 56.7 kg   SpO2 97%   BMI 20.18 kg/m   Physical Exam  Physical Exam  Constitutional: Pt appears well-developed and well-nourished. No distress.  HENT:  Head: Normocephalic and atraumatic.  Mouth/Throat: Oropharynx is clear and moist. No oropharyngeal exudate.  Eyes: Conjunctivae are normal.  Neck: Normal range of motion. Neck supple.  Full ROM without pain  Cardiovascular: Normal rate, regular rhythm and intact distal pulses.   Pulmonary/Chest: Effort normal and breath sounds normal. No respiratory distress. Pt has no wheezes.  Abdominal: Soft. Pt exhibits no distension. There is no tenderness, rebound or guarding. No abd bruit or pulsatile  mass Musculoskeletal:  Full range of motion of the T-spine and L-spine with flexion, hyperextension, and lateral flexion. No midline tenderness or stepoffs. No tenderness to palpation of the spinous processes of the T-spine or L-spine. Mild tenderness to palpation of the paraspinous muscles of the L-spine. Negative straight leg raise. Left BKA.  Pelvis stable and nontender to palpation.  Able to straight leg raise on right without difficulty.  Patient able to flex however has pain with flexion of his right knee.  Right ankle with plantar flexion dorsiflexion without difficulty.  Wiggles toes without difficulty. Lymphadenopathy:    Pt has no cervical adenopathy.  Neurological: Pt is alert. Pt has normal reflexes.  Speech is clear and goal oriented, follows commands Normal 5/5 strength in upper extremities bilaterally including dorsiflexion and plantar flexion, strong and equal grip strength. 5/5 strength in RLE. Sensation normal to light and sharp touch Moves extremities without ataxia, coordination intact  gait with limp however at baseline.  Patient will left lower extremity BKA. Normal balance No Clonus Skin: Skin is warm and dry. No rash noted or lesions noted. Pt is not diaphoretic. No erythema, ecchymosis,edema or warmth.  Psychiatric: Pt has a normal mood and affect. Behavior is normal.  Nursing note and vitals reviewed. ED Treatments / Results  Labs (all labs ordered are listed, but only abnormal results are displayed) Labs Reviewed - No data to display  EKG None  Radiology Dg Thoracic Spine 2 View  Result Date: 09/21/2018 CLINICAL DATA:  Recent slip and fall with back pain, initial encounter EXAM: THORACIC SPINE 2 VIEWS COMPARISON:  None. FINDINGS: Mild scoliosis is noted. Pedicles are within normal limits. Vertebral body height is well maintained. No acute rib abnormality is seen. IMPRESSION: No acute abnormality noted. Electronically Signed   By: Inez Catalina M.D.   On:  09/21/2018 19:43   Dg Lumbar Spine Complete  Result Date: 09/21/2018 CLINICAL DATA:  Recent slip and fall with lumbar pain EXAM: LUMBAR SPINE - COMPLETE 4+ VIEW COMPARISON:  None. FINDINGS: There is no evidence of lumbar spine fracture. Alignment is normal. Intervertebral disc spaces are maintained. IMPRESSION: No acute abnormality noted. Electronically Signed   By: Inez Catalina M.D.   On: 09/21/2018 19:38   Dg Pelvis 1-2 Views  Result Date: 09/21/2018 CLINICAL DATA:  Recent slip and fall with pelvic pain, initial encounter EXAM: PELVIS - 1-2 VIEW COMPARISON:  None. FINDINGS: Pelvic ring is intact. Prior deformity of the left acetabulum and proximal left femur is seen. No acute fracture or dislocation is noted. IMPRESSION: Chronic changes without acute abnormality. Electronically Signed   By: Inez Catalina M.D.   On: 09/21/2018 19:40   Dg Knee Complete 4 Views Right  Result Date: 09/21/2018 CLINICAL DATA:  Slip and fall today with right knee pain, initial encounter  EXAM: RIGHT KNEE - COMPLETE 4+ VIEW COMPARISON:  11/25/2013 FINDINGS: Degenerative changes of the knee joint are noted stable from the prior exam. No joint effusion is seen. No acute fracture is noted. IMPRESSION: Chronic changes without acute abnormality. Electronically Signed   By: Alcide CleverMark  Lukens M.D.   On: 09/21/2018 19:42    Procedures Procedures (including critical care time)  Medications Ordered in ED Medications  lidocaine (LIDODERM) 5 % 1 patch (has no administration in time range)  acetaminophen (TYLENOL) tablet 1,000 mg (1,000 mg Oral Given 09/21/18 1838)   Initial Impression / Assessment and Plan / ED Course  I have reviewed the triage vital signs and the nursing notes.  Pertinent labs & imaging results that were available during my care of the patient were reviewed by me and considered in my medical decision making (see chart for details).  33 year old male appears otherwise well presents for evaluation after mechanical  fall which occurred just PTA.  He denies hitting his head, LOC or anticoagulation.  Patient with left lower extremity BKA and decreased range of motion to his right lower extremity at baseline.  He is ambulated in ED without difficulty.  Tenderness palpation to right knee, right pelvis as well as thoracic and lumbar and right paraspinal muscle tenderness.  He is neurovascularly intact without focal neurologic deficits.  He has no bowel or bladder incontinence, saddle paresthesia, history IV drug use, malignancy or chronic steroid use.  No red flags for back pain.  Have low suspicion for cauda equina, discitis, transverse myelitis, osteomyelitis, acute fracture.  Plain films in ED without acute abnormality.  No shortening or rotation of right extremity to suggest hip fracture.  He has full range of motion to his right hip.  He does have decreased range of motion with flexion to his right knee which patient states this is chronic at baseline.  Wiggle toes without difficulty.  2+ DP, PT pulses on right.  There is bilateral upper shoulders without difficulty.  He is ambulatory at his baseline.  Discussed rice for symptomatic management.  Patient to follow-up with PCP for any recurrent symptoms.  I have low suspicion for acute intracranial, abdominal pathology.  The patient has been appropriately medically screened and/or stabilized in the ED. I have low suspicion for any other emergent medical condition which would require further screening, evaluation or treatment in the ED or require inpatient management.  Patient is hemodynamically stable and in no acute distress.  Patient able to ambulate in department prior to ED.  Evaluation does not show acute pathology that would require ongoing or additional emergent interventions while in the emergency department or further inpatient treatment.  I have discussed the diagnosis with the patient and answered all questions.  Pain is been managed while in the emergency  department and patient has no further complaints prior to discharge.  Patient is comfortable with plan discussed in room and is stable for discharge at this time.  I have discussed strict return precautions for returning to the emergency department.  Patient was encouraged to follow-up with PCP/specialist refer to at discharge.     Final Clinical Impressions(s) / ED Diagnoses   Final diagnoses:  Fall, initial encounter  Lumbar back pain  Acute pain of right knee    ED Discharge Orders         Ordered    lidocaine (LIDODERM) 5 %  Every 24 hours     09/21/18 2021  Henderly, Britni A, PA-C 09/21/18 2023    Raeford RazorKohut, Stephen, MD 09/22/18 [id number]

## 2018-09-21 NOTE — Discharge Instructions (Signed)
Base lidocaine patches for 12 hours and remove for 12 hours.  You must be patch free for 12 hours before he completes additional patch.  Follow-up with PCP for recurrent symptoms or orthopedics.  I have referred you to one at discharge.  Also suggest ice, Tylenol and elevation.  Return to the ED for any new worsening symptoms.

## 2018-09-21 NOTE — ED Notes (Signed)
Pt able to ambulate around room independently. Pt left lower leg amputated with right lower leg limited movement at baseline. PA notified.

## 2018-09-21 NOTE — ED Triage Notes (Signed)
Pt in c/o falling today d/t the rain, pt fell onto the floor and c/o lower  bil back pain reported as spasms, pt c/o R knee pain with pain bearing weight, pt A& O x4, denies hitting head and denies LOC

## 2018-09-21 NOTE — ED Notes (Signed)
Pt at xray. Will assess pt when they return to room

## 2018-11-24 ENCOUNTER — Other Ambulatory Visit: Payer: Self-pay | Admitting: Family Medicine

## 2018-11-24 DIAGNOSIS — M5414 Radiculopathy, thoracic region: Secondary | ICD-10-CM

## 2018-11-25 ENCOUNTER — Other Ambulatory Visit: Payer: Medicaid Other

## 2018-12-09 ENCOUNTER — Other Ambulatory Visit: Payer: Medicaid Other

## 2018-12-20 ENCOUNTER — Ambulatory Visit
Admission: RE | Admit: 2018-12-20 | Discharge: 2018-12-20 | Disposition: A | Discharge status: Home / Self Care | Payer: Medicaid Other | Source: Ambulatory Visit | Attending: Family Medicine | Admitting: Family Medicine

## 2018-12-20 ENCOUNTER — Other Ambulatory Visit: Payer: Self-pay

## 2018-12-20 DIAGNOSIS — M5414 Radiculopathy, thoracic region: Secondary | ICD-10-CM

## 2018-12-20 MED ORDER — GADOBENATE DIMEGLUMINE 529 MG/ML IV SOLN
10.0000 mL | Freq: Once | INTRAVENOUS | Status: AC | PRN
  Administered 2018-12-20: 10 mL via INTRAVENOUS

## 2019-01-02 ENCOUNTER — Other Ambulatory Visit: Payer: Self-pay

## 2019-01-02 ENCOUNTER — Ambulatory Visit: Payer: Medicaid Other | Attending: Family Medicine | Admitting: Physical Therapy

## 2019-01-02 DIAGNOSIS — M5440 Lumbago with sciatica, unspecified side: Secondary | ICD-10-CM | POA: Diagnosis not present

## 2019-01-02 DIAGNOSIS — M546 Pain in thoracic spine: Secondary | ICD-10-CM | POA: Insufficient documentation

## 2019-01-02 DIAGNOSIS — R2689 Other abnormalities of gait and mobility: Secondary | ICD-10-CM | POA: Insufficient documentation

## 2019-01-02 DIAGNOSIS — M6281 Muscle weakness (generalized): Secondary | ICD-10-CM | POA: Insufficient documentation

## 2019-01-02 DIAGNOSIS — G8929 Other chronic pain: Secondary | ICD-10-CM | POA: Diagnosis present

## 2019-01-02 NOTE — Therapy (Signed)
[facility] [address] [place], [place], [id number] Phone: [phone]   Fax:  [phone]  Physical Therapy Evaluation  Patient Details  Name: Joseph Bond MRN: [id number] Date of Birth: January 07, 1986 Referring Provider (PT): Stevphen RochesterManfredi, Brenda L MD   Encounter Date: 01/02/2019  PT End of Session - 01/02/19 1512    Visit Number  1    Number of Visits  4    Date for PT Re-Evaluation  01/23/19    Authorization Type  MCD    PT Start Time  1501    PT Stop Time  1555    PT Time Calculation (min)  54 min    Activity Tolerance  Patient limited by pain    Behavior During Therapy  [facility] for tasks assessed/performed       Past Medical History:  Diagnosis Date  . Asthma    childhood asthma    Past Surgical History:  Procedure Laterality Date  . BELOW KNEE LEG AMPUTATION      There were no vitals filed for this visit.   Subjective Assessment - 01/02/19 1510    Subjective  [facility] July [facility].  I now have spasms and shooting pains down my left leg.  My pain is progressing.    Pertinent History  limited AROM of RT knee 10 degrees of flex from birth.  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  must be propped and 5-10 minutes    How long can you stand comfortably?  5 minutes    How long can you walk comfortably?  5 -10 minutes    Diagnostic tests  MRI- no traumatic findings    Patient Stated Goals  I want to have pain controlled.    Currently in Pain?  Yes    Pain Score  9     Pain Location  Back    Pain Orientation  Right;Left    Pain Descriptors / Indicators  Burning;Stabbing;Spasm    Pain Type  Chronic pain    Pain Radiating Towards  radiates into left leg    Aggravating Factors   any movement    Pain Relieving Factors  heat and ice temporary          [facility] PT Assessment - 01/02/19 0001      Assessment   Medical Diagnosis  Stevphen RochesterManfredi, Brenda L MD    Referring Provider (PT)  Stevphen RochesterManfredi, Brenda L MD    Onset Date/Surgical Date  09/06/18   approximately   Hand Dominance  Right    Next MD Visit  not scheduled    Prior Therapy  none      Precautions   Precautions  None      Restrictions   Weight Bearing Restrictions  No      Balance Screen   Has the patient fallen in the past 6 months  No    Has the patient had a decrease in activity level because of a fear of falling?   No    Is the patient reluctant to leave their home because of a fear of falling?   No      Home Environment   Living Environment  Private residence    Living Arrangements  Other relatives   sister  Home Access  Stairs to enter    Entrance Stairs-Number of Steps  6    Entrance Stairs-Rails  Right    Home Layout  Two level      Prior Function   Level of Independence  Independent    Vocation  On disability      Cognition   Overall Cognitive Status  Within Functional Limits for tasks assessed      Observation/Other Assessments   Focus on Therapeutic Outcomes (FOTO)   not taken MCD      ROM / Strength   AROM / PROM / Strength  AROM;Strength      AROM   Overall AROM   Deficits    Overall AROM Comments  RT knee fused 10 degrees,  LT BKA    Lumbar Flexion  25 %    Lumbar Extension  10 %    Lumbar - Right Side Bend  25 % available    Lumbar - Left Side Bend  50 % available    Lumbar - Right Rotation  25% available range    Lumbar - Left Rotation  10% available   pain limiting   Thoracic Flexion  50%    Thoracic Extension  20% available range    Thoracic - Right Side Bend  25 %     Thoracic - Left Side Bend  50%    Thoracic - Right Rotation  25%    Thoracic - Left Rotation  25%      Strength   Overall Strength  Deficits    Right Hip Flexion  4/5    Right Hip Extension  4-/5    Right Hip ABduction  4-/5    Left Hip Flexion  4-/5    Left Hip  Extension  3+/5    Left Hip ABduction  3-/5      Palpation   Palpation comment  bil TTP/spasm of Quadratus Lumborum to T-12 ribs to iliac crest                Objective measurements completed on examination: See above findings.      [facility] Adult PT Treatment/Exercise - 01/02/19 0001      Lumbar Exercises: Stretches   Other Lumbar Stretch Exercise  seated low back stretch modified for knee to chest due to inability to flex due to fused knee joint/ congenitall defect      Modalities   Modalities  Electrical Stimulation;Moist Heat      Moist Heat Therapy   Number Minutes Moist Heat  15 Minutes    Moist Heat Location  Lumbar Spine      Electrical Stimulation   Electrical Stimulation Location  Lumbar    Electrical Stimulation Action  IFC    Electrical Stimulation Parameters  76ma to pt tolerance    Electrical Stimulation Goals  Pain      Manual Therapy   Manual Therapy  Soft tissue mobilization    Manual therapy comments  skilled palpation fo TPDN    Soft tissue mobilization  Left QL       Trigger Point Dry Needling - 01/02/19 0001    Consent Given?  Yes    Education Handout Provided  Yes    Muscles Treated Back/Hip  Quadratus lumborum   LT only   Dry Needling Comments  60 mm    Quadratus Lumborum Response  Twitch response elicited;Palpable increased muscle length           PT Education - 01/02/19 1543  Education Details  POC  Explanation of findings  Education on TPDN initial exericise stretch    Person(s) Educated  Patient    Methods  Explanation;Demonstration;Verbal cues;Tactile cues    Comprehension  Verbalized understanding       PT Short Term Goals - 01/02/19 1700      PT SHORT TERM GOAL #1   Title  Pt will be independent with initial HEP    Baseline  No knowledge of exericise    Time  3    Period  Weeks    Status  New    Target Date  01/23/19      PT SHORT TERM GOAL #2   Title  Pt will be able to sit for 30 minutes comfortably     Baseline  10 min max at eval    Time  3    Period  Weeks    Status  New    Target Date  01/23/19      PT SHORT TERM GOAL #3   Title  Improve mobility to perform sit to stand with minimal UE support    Baseline  Pt in intense pain on eval and required bil UE support to rise from chair    Time  3    Period  Weeks    Status  New    Target Date  01/23/19      PT SHORT TERM GOAL #4   Title  Demonstrate 4/5 bil  LE strength to imporve stability, safety and endurance of community level function    Baseline  Pt with RT BKA, and fused Left knee, strength 3- to 4/5 in Bil LE  See chart    Time  3    Period  Weeks    Status  New    Target Date  01/23/19        PT Long Term Goals - 01/02/19 1546      PT LONG TERM GOAL #1   Title  TBD after re evaluation on 01/23/19    Time  --    Period  --    Status  --    Target Date  --             Plan - 01/02/19 1559    Clinical Impression Statement  33 yo pt on disabilty for congenital defects,  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  Pt complains of low back/Quadratus Lumborum spasm.  Pt with very limited AROM of thoracic/lumbar spine due to pain and spasm.  Pt consented to TPDN for back spasms and was closely monitored throughout session. Pain 9/10 reduced to 7/10 at end of session.  Pt will benefit from skilled PT for impairments and return to PLOF    Stability/Clinical Decision Making  Stable/Uncomplicated    Clinical Decision Making  Low    Rehab Potential  Good    PT Frequency  1x / week    PT Duration  3 weeks    PT Treatment/Interventions  Cryotherapy;Electrical Stimulation;Iontophoresis /ml Dexamethasone;Moist Heat;Traction;Ultrasound;Gait training;Stair training;Therapeutic activities;Therapeutic exercise;Neuromuscular re-education;Manual techniques;Patient/family education;Passive range of motion;Dry needling;Taping;Joint Manipulations    PT Next Visit Plan  assess TPDN, e stim  and give basic HEP    PT Home Exercise Plan  modified seated knee to chest    Consulted and Agree with Plan of Care  Patient       Patient will benefit from skilled therapeutic intervention in order to improve the following  deficits and impairments:  Pain, Abnormal gait, Decreased mobility, Decreased range of motion, Decreased strength, Increased fascial restricitons, Increased muscle spasms, Postural dysfunction, Improper body mechanics  Visit Diagnosis: Chronic bilateral low back pain with sciatica, sciatica laterality unspecified  Pain in thoracic spine  Muscle weakness (generalized)  Other abnormalities of gait and mobility     Problem List Patient Active Problem List   Diagnosis Date Noted  . Subluxation of patella, acquired, left, initial encounter 02/19/2016  . 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 ADD (attention deficit disorder) 02/19/2016    Voncille Lo, PT Certified Exercise Expert for the Aging Adult  01/02/19 5:37 PM Phone: [phone] Fax: [facility] [address] [place], [place], [address] Phone: [phone]   Fax:  [phone]  Name: Joseph Bond MRN: [id number] Date of Birth: 09/29/1985

## 2019-01-02 NOTE — Patient Instructions (Signed)
Trigger Point Dry Needling  . What is Trigger Point Dry Needling (DN)? o DN is a physical therapy technique used to treat muscle pain and dysfunction. Specifically, DN helps deactivate muscle trigger points (muscle knots).  o A thin filiform needle is used to penetrate the skin and stimulate the underlying trigger point. The goal is for a local twitch response (LTR) to occur and for the trigger point to relax. No medication of any kind is injected during the procedure.   . What Does Trigger Point Dry Needling Feel Like?  o The procedure feels different for each individual patient. Some patients report that they do not actually feel the needle enter the skin and overall the process is not painful. Very mild bleeding may occur. However, many patients feel a deep cramping in the muscle in which the needle was inserted. This is the local twitch response.   Marland Kitchen How Will I feel after the treatment? o Soreness is normal, and the onset of soreness may not occur for a few hours. Typically this soreness does not last longer than two days.  o Bruising is uncommon, however; ice can be used to decrease any possible bruising.  o In rare cases feeling tired or nauseous after the treatment is normal. In addition, your symptoms may get worse before they get better, this period will typically not last longer than 24 hours.   . What Can I do After My Treatment? o Increase your hydration by drinking more water for the next 24 hours. o You may place ice or heat on the areas treated that have become sore, however, do not use heat on inflamed or bruised areas. Heat often brings more relief post needling. o You can continue your regular activities, but vigorous activity is not recommended initially after the treatment for 24 hours. o DN is best combined with other physical therapy such as strengthening, stretching, and other therapies.   Do modified knee to chest sitting on edge of bed and leaning forward  Hold for 30 sec  do 3 x   3 x a day  Voncille Lo, PT Certified Exercise Expert for the Aging Adult  01/02/19 3:41 PM Phone: [phone] Fax: [phone]

## 2019-01-16 ENCOUNTER — Ambulatory Visit: Payer: Medicaid Other | Admitting: Physical Therapy

## 2019-01-23 ENCOUNTER — Other Ambulatory Visit: Payer: Self-pay

## 2019-01-23 ENCOUNTER — Encounter: Payer: Self-pay | Admitting: Physical Therapy

## 2019-01-23 ENCOUNTER — Ambulatory Visit: Payer: Medicaid Other | Admitting: Physical Therapy

## 2019-01-23 DIAGNOSIS — M6281 Muscle weakness (generalized): Secondary | ICD-10-CM

## 2019-01-23 DIAGNOSIS — R2689 Other abnormalities of gait and mobility: Secondary | ICD-10-CM

## 2019-01-23 DIAGNOSIS — M5440 Lumbago with sciatica, unspecified side: Secondary | ICD-10-CM | POA: Diagnosis not present

## 2019-01-23 DIAGNOSIS — M546 Pain in thoracic spine: Secondary | ICD-10-CM

## 2019-01-23 DIAGNOSIS — G8929 Other chronic pain: Secondary | ICD-10-CM

## 2019-01-23 NOTE — Therapy (Signed)
[place], [place], [address] Phone: [phone]   Fax:  [phone]  Physical Therapy Treatment/Recertification  Patient Details  Name: Joseph Bond MRN: [id number] Date of Birth: 20-Oct-1985 Referring Provider (PT): Jolinda Croak MD   Encounter Date: 01/23/2019  PT End of Session - 01/23/19 1503    Visit Number  2    Number of Visits  4    Date for PT Re-Evaluation  02/27/19    Authorization Type  MCD though 61-6-07/  POC for recert is through 37-10-62    PT Start Time  1501    PT Stop Time  1540    PT Time Calculation (min)  39 min    Activity Tolerance  Patient tolerated treatment well    Behavior During Therapy  [facility] for tasks assessed/performed       Past Medical History:  Diagnosis Date  . Asthma    childhood asthma    Past Surgical History:  Procedure Laterality Date  . BELOW KNEE LEG AMPUTATION      There were no vitals filed for this visit.  Subjective Assessment - 01/23/19 1523    Subjective  I fell again and I subluxed my knee with my LT BKA and I was in my room. I couldnt get a ride to the ER.  Last night I passed a kidney stone , It has been a rough week. ihave such problems trying to walk and catch bus. I had a subluxation of my knee with my BKA on my RT leg.  I am sorry I am late or cant come but transportation is an issue for me    Pertinent History  limited AROM of RT knee 10 degrees of flex from birth.  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  sit for an hour with pillow prop    How long can you stand comfortably?  5 minutes    How long can you walk comfortably?  5 -10 minutes    Diagnostic tests  MRI- no traumatic findings    Patient Stated Goals  I want to have pain controlled.    Currently in Pain?  Yes    Pain Score  6     Pain Location  Back    Pain Orientation   Right;Left    Pain Descriptors / Indicators  Aching    Pain Type  Chronic pain    Pain Radiating Towards  radiates into the left leg especially when  walking  to and from busses    Pain Onset  More than a month ago    Pain Frequency  Intermittent         [facility] PT Assessment - 01/23/19 0001      Assessment   Medical Diagnosis  Jolinda Croak MD    Referring Provider (PT)  Jolinda Croak MD    Onset Date/Surgical Date  09/06/18   approximately     Observation/Other Assessments   Focus on Therapeutic Outcomes (FOTO)   not taken MCD      AROM   Overall AROM   Deficits    Overall AROM Comments  RT knee fused 10 degrees,  LT BKA    Lumbar Flexion  25 %    Lumbar Extension  10 %    Lumbar - Right Side Bend  25 % available  Lumbar - Left Side Bend  50 % available    Lumbar - Right Rotation  25% available range    Lumbar - Left Rotation  10% available   pain limiting   Thoracic Flexion  50%    Thoracic Extension  20% available range    Thoracic - Right Side Bend  25 %     Thoracic - Left Side Bend  50%    Thoracic - Right Rotation  25%    Thoracic - Left Rotation  25%      Strength   Overall Strength  Deficits    Right Hip Flexion  4/5    Right Hip Extension  4-/5    Right Hip ABduction  4-/5    Left Hip Flexion  4-/5    Left Hip Extension  3+/5    Left Hip ABduction  3-/5      Palpation   Palpation comment  bil TTP/spasm of Quadratus Lumborum to T-12 ribs to iliac crest but has decreased tissue tension since eval      Transfers   Five time sit to stand comments   13.01 sec                    [facility] Adult PT Treatment/Exercise - 01/23/19 0001      Lumbar Exercises: Stretches   Other Lumbar Stretch Exercise  seated low back stretch modified for knee to chest due to inability to flex due to fused knee joint/ congenitall defect      Lumbar Exercises: Prone   Single Arm Raises Limitations  prone press up x 5     Other Prone Lumbar Exercises  upper  Body Sequence always start with pelvic press then bil UE T, W M and Y x 10    Other Prone Lumbar Exercises  pelvic press 5 sec hold, then Pelvic press with LT knee flex x 10, then bil x 10 hip extension with pelvic press,  then Left knee flex with hip ext.         Manual Therapy   Manual Therapy  Soft tissue mobilization    Manual therapy comments  skilled palpation fo TPDN    Soft tissue mobilization  Left QL       Trigger Point Dry Needling - 01/23/19 0001    Consent Given?  Yes    Education Handout Provided  Previously provided    Muscles Treated Back/Hip  Quadratus lumborum;Lumbar multifidi;Gluteus maximus   LT only   Dry Needling Comments  60 mm    Gluteus Maximus Response  Twitch response elicited;Palpable increased muscle length    Lumbar multifidi Response  Twitch response elicited;Palpable increased muscle length    Quadratus Lumborum Response  Twitch response elicited;Palpable increased muscle length             PT Short Term Goals - 01/23/19 1533      PT SHORT TERM GOAL #1   Title  Pt will be independent with initial HEP    Baseline  given HEP for home and pt can demo    Time  3    Period  Weeks    Status  Achieved    Target Date  01/23/19      PT SHORT TERM GOAL #2   Title  Pt will be able to sit for 30 minutes comfortably    Baseline  can sit and watch show for an hour and uses pillow    Time  3  Period  Weeks    Status  Achieved    Target Date  01/23/19      PT SHORT TERM GOAL #3   Title  Improve mobility to perform sit to stand with minimal UE support    Baseline  Pt able to rise to performs 5 x STS in 13 sec using UE due to rigid RT knee, must use UE without exacerbating thoracic pain    Time  3    Period  Weeks    Status  Partially Met    Target Date  01/23/19      PT SHORT TERM GOAL #4   Title  Demonstrate 4/5 bil  LE strength to imporve stability, safety and endurance of community level function    Baseline  Pt with RT BKA, and fused Left  knee, strength 3- to 4/5 in Bil LE  See chart    Time  3    Period  Weeks    Status  On-going    Target Date  01/23/19        PT Long Term Goals - 01/23/19 1523      PT LONG TERM GOAL #1   Title  Pt will be independent with advanced HEP    Baseline  only has initial HEP    Time  5    Period  Weeks    Status  New    Target Date  02/27/19      PT LONG TERM GOAL #2   Title  Demonstrate 4/5 bil  LE strength to imporve stability, safety and endurance of community level function    Baseline  Pt with RT BKA, and fused Left knee, strength 3- to 4/5 in Bil LE  See chart    Time  5    Period  Weeks    Status  New    Target Date  02/27/19      PT LONG TERM GOAL #3   Title  Pt will tolerate sitting 1 hour without increased pain to ride in car / public transportation without increased pain    Baseline  Pt is able to sit for 30 minutes now in seat with minimal increase in pain    Time  5    Period  Weeks    Status  New    Target Date  02/27/19      PT LONG TERM GOAL #4   Title  pt with be able to walk/stand >/= 1 hour with no AD with </= 2/10 pain for functional endurance and return to leisure activities post DC    Baseline  Pt can only tolerate 5-10  minutes standing    Time  5    Period  Weeks    Status  New    Target Date  02/27/19      PT LONG TERM GOAL #5   Title  Pt will be aware of fall prevention strategies and verbalize at RX session    Baseline  needs reinforcement and possilble AD for prevention of falls    Time  5    Period  Weeks    Status  New    Target Date  02/27/19            Plan - 01/23/19 1535    Clinical Impression Statement  Pt has only been able to attend eval/and one visit due to transportation issues/ kidney stones.  Pt would benefit from additional skilled PT as well help with transportation due to  difficulty ambulating with LT BKA and fused RT knee.  Pt educated on prone pelvic press and prone press up. Pt has had falls recently and may need to  utilized assistive device to prevent falls if he is unable to strengthen remaining musculature.  Pt has congental orthopedic problems and needs skilled PT to address deficits    Personal Factors and Comorbidities  Comorbidity 1;Comorbidity 2    Comorbidities  limited AROM of RT knee 10 degrees of flex from birth.  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, RT hip congenital defect    Examination-Activity Limitations  Locomotion Level;Stairs;Transfers    Examination-Participation Restrictions  Community Activity    Stability/Clinical Decision Making  Stable/Uncomplicated    Clinical Decision Making  Low    Rehab Potential  Good    PT Frequency  2x / week    PT Duration  --   5 weeks   PT Treatment/Interventions  Cryotherapy;Electrical Stimulation;Iontophoresis 40m/ml Dexamethasone;Moist Heat;Traction;Ultrasound;Gait training;Stair training;Therapeutic activities;Therapeutic exercise;Neuromuscular re-education;Manual techniques;Patient/family education;Passive range of motion;Dry needling;Taping;Joint Manipulations    PT Next Visit Plan  continue goal setting and MCD authorization    PT Home Exercise Plan  modified seated knee to chest pelvic press and UE T< W< M Y    Consulted and Agree with Plan of Care  Patient       Patient will benefit from skilled therapeutic intervention in order to improve the following deficits and impairments:  Pain, Abnormal gait, Decreased mobility, Decreased range of motion, Decreased strength, Increased fascial restricitons, Increased muscle spasms, Postural dysfunction, Improper body mechanics  Visit Diagnosis: Chronic bilateral low back pain with sciatica, sciatica laterality unspecified  Pain in thoracic spine  Muscle weakness (generalized)  Other abnormalities of gait and mobility     Problem List Patient Active Problem List   Diagnosis Date Noted  . Subluxation of patella, acquired, left, initial encounter 02/19/2016  . Arthrosis of knee  02/19/2016  . Absence of [place] leg [place] 02/19/2016  . ADD (attention deficit disorder) 02/19/2016   LVoncille Lo PT Certified Exercise Expert for the Aging Adult  01/23/19 5:42 PM Phone: [phone]: [facility] [address] [address]Phone: [phone]  Fax:  [phone] Name: Joseph SpeirMRN: [id number] of Birth: [birth year]

## 2019-01-30 ENCOUNTER — Encounter: Payer: Self-pay | Admitting: Physical Therapy

## 2019-01-30 ENCOUNTER — Ambulatory Visit: Payer: Medicaid Other | Attending: Family Medicine | Admitting: Physical Therapy

## 2019-01-30 ENCOUNTER — Other Ambulatory Visit: Payer: Self-pay

## 2019-01-30 DIAGNOSIS — M546 Pain in thoracic spine: Secondary | ICD-10-CM | POA: Diagnosis present

## 2019-01-30 DIAGNOSIS — M6281 Muscle weakness (generalized): Secondary | ICD-10-CM

## 2019-01-30 DIAGNOSIS — G8929 Other chronic pain: Secondary | ICD-10-CM | POA: Insufficient documentation

## 2019-01-30 DIAGNOSIS — R2689 Other abnormalities of gait and mobility: Secondary | ICD-10-CM | POA: Diagnosis present

## 2019-01-30 DIAGNOSIS — M5440 Lumbago with sciatica, unspecified side: Secondary | ICD-10-CM | POA: Diagnosis present

## 2019-01-30 NOTE — Patient Instructions (Addendum)
Access Code: [email or web address]  URL: [email or web address]  Date: 01/30/2019  Prepared by: Voncille Lo   Exercises  Seated Quadratus Lumborum Stretch with Forward Bend - 3-5 reps - 1 sets - 20-30 sec hold - 1x daily - 7x weekly  Seated Quadratus Lumborum Stretch with Arm Overhead - 3-5 reps - 1 sets - 20-30 sec hold - 1x daily - 7x weekly  Seated Thoracic Lumbar Extension with Pectoralis Stretch - 3-5 reps - 1 sets - 20-30 sec hold - 1x daily - 7x weekly  Seated Lumbar Flexion Stretch - 3-5 reps - 1 sets - 20-30 hold - 1x daily - 7x weekly  Prone Hip Extension on Table - 15 reps - 3 sets - 1x daily - 7x weekly  Supine Straight Leg Raises - 15 reps - 3 sets - 1x daily - 7x weekly  Sidelying Hip Abduction - 15 reps - 3 sets - 1x daily - 7x weekly                 Voncille Lo, PT Certified Exercise Expert for the Aging Adult  01/30/19 3:43 PM Phone: [phone] Fax: [phone]

## 2019-01-30 NOTE — Therapy (Signed)
[place], [place], [address] Phone: [phone]   Fax:  [phone]  Physical Therapy Treatment  Patient Details  Name: Joseph Bond MRN: [id number] Date of Birth: [birth year] Referring Provider (PT): Jolinda Croak MD   Encounter Date: 01/30/2019  PT End of Session - 01/30/19 1506    Visit Number  3    Number of Visits  4    Date for PT Re-Evaluation  02/27/19    Authorization Type  POC for recert is through 21-11-55  MCD 2nd authorization submitted 01-30-19 for after 02-04-19    PT Start Time  1502    PT Stop Time  1544    PT Time Calculation (min)  42 min    Activity Tolerance  Patient tolerated treatment well    Behavior During Therapy  [facility] for tasks assessed/performed       Past Medical History:  Diagnosis Date  . Asthma    childhood asthma    Past Surgical History:  Procedure Laterality Date  . BELOW KNEE LEG AMPUTATION      There were no vitals filed for this visit.  Subjective Assessment - 01/30/19 1503    Subjective  I want to be a MOA and go to school.  I was able to call for transportation today.  It helped so much.    Pertinent History  limited AROM of RT knee 10 degrees of flex from birth.  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, RT hip congenital defect    Limitations  Sitting;Standing;Walking    Pain Score  5     Pain Location  Back    Pain Orientation  Right;Left    Pain Descriptors / Indicators  Aching    Pain Type  Chronic pain    Pain Onset  More than a month ago    Pain Frequency  Intermittent         [facility] PT Assessment - 01/30/19 0001      Assessment   Medical Diagnosis  chronic low back pain, thoracic pain , muscle weakness and difficulty walking     Referring Provider (PT)  Jolinda Croak MD    Onset Date/Surgical Date  09/06/18   approximately   Hand Dominance  Right    Next MD Visit  not scheduled    Prior Therapy  none      Balance Screen    Has the patient fallen in the past 6 months  No    Has the patient had a decrease in activity level because of a fear of falling?   No    Is the patient reluctant to leave their home because of a fear of falling?   No      Home Environment   Living Environment  Private residence    Living Arrangements  Other relatives   sister   Home Access  Stairs to enter    Entrance Stairs-Number of Steps  6    Entrance Stairs-Rails  Right    [place]  Two level      Prior Function   Level of [place]  On disability      Observation/Other Assessments   Focus on Therapeutic Outcomes (FOTO)   not taken MCD      AROM   Overall AROM   Deficits    Overall AROM Comments  RT knee fused 10 degrees,  LT BKA  Lumbar Flexion  35%    Lumbar Extension  20%    Lumbar - Right Side Bend  25 % available    Lumbar - Left Side Bend  50 % available    Lumbar - Right Rotation  25% available range    Lumbar - Left Rotation  10% available   pain limiting   Thoracic Flexion  50%    Thoracic Extension  20% available range    Thoracic - Right Side Bend  25 %     Thoracic - Left Side Bend  50%    Thoracic - Right Rotation  25%    Thoracic - Left Rotation  25%      Strength   Overall Strength  Deficits    Right Hip Flexion  4/5    Right Hip Extension  4-/5    Right Hip ABduction  4-/5    Left Hip Flexion  4-/5    Left Hip Extension  3+/5    Left Hip ABduction  3-/5      Palpation   Palpation comment  bil TTP/spasm of Quadratus Lumborum to T-12 ribs to iliac crest but has decreased tissue tension since eval                   [facility] Adult PT Treatment/Exercise - 01/30/19 0001      Transfers   Five time sit to stand comments   12.55 sec       Lumbar Exercises: Stretches   Other Lumbar Stretch Exercise  seated low back stretch modified for knee to chest due to inability to flex due to fused knee joint/ congenitall defect  30 sec x 4   added to HEP   Other  Lumbar Stretch Exercise  seated low back Quadratus lumborum side stretch in seat  to RT and LT 2 x 30 sec each  and also bending forward and to  Rt and LT 2 x 30 sec each  thoracic stretch in chair 30 sec x 3      Lumbar Exercises: Supine   Ab Set  10 reps    AB Set Limitations  10 sec    Straight Leg Raise  10 reps    Straight Leg Raises Limitations  x3 wtih 5 lb on RT  10 x 3 on lt with BKA prosthesis      Lumbar Exercises: Sidelying   Hip Abduction Weights (lbs)  2 x 10 RT sidelying and then LT sidelying     Hip Abduction Limitations  VC for keeping heel on wall for correct execution      Lumbar Exercises: Prone   Single Arm Raises Limitations  prone press up x 5     Other Prone Lumbar Exercises  standing with belly on high table prone hip extension 3 x 10 RT and then LT    Other Prone Lumbar Exercises  pelvic press 5 sec hold, then Pelvic press with LT knee flex x 10, then bil x 10 hip extension with pelvic press,  then Left knee flex with hip ext.                PT Education - 01/30/19 1703    Education Details  added to HEP with strength and stretching for pain reduction    Person(s) Educated  Patient    Methods  Explanation;Demonstration;Tactile cues;Verbal cues;Handout    Comprehension  Verbalized understanding;Returned demonstration       PT Short Term Goals - 01/30/19 1507  PT SHORT TERM GOAL #1   Title  Pt will be independent with initial HEP    Baseline  given HEP for home and pt can demo    Time  3    Period  Weeks    Status  Achieved      PT SHORT TERM GOAL #2   Title  Pt will be able to sit for 30 minutes comfortably    Baseline  can sit and watch show for an hour and uses pillow to support    Time  3    Period  Weeks    Status  Achieved      PT SHORT TERM GOAL #3   Title  Improve mobility to perform sit to stand with minimal UE support    Baseline  Pt was able to perform 5 x STS in 12.55 sec using UE  minimal increase in pain    Time  3     Period  Weeks    Status  Partially Met      PT SHORT TERM GOAL #4   Title  Demonstrate 4/5 bil  LE strength to imporve stability, safety and endurance of community level function    Baseline  Pt with RT BKA, and fused Left knee, strength 3- to 4/5 in Bil LE  See chart    Time  3    Period  Weeks    Status  On-going        PT Long Term Goals - 01/23/19 1523      PT LONG TERM GOAL #1   Title  Pt will be independent with advanced HEP    Baseline  only has initial HEP    Time  5    Period  Weeks    Status  New    Target Date  02/27/19      PT LONG TERM GOAL #2   Title  Demonstrate 4/5 bil  LE strength to imporve stability, safety and endurance of community level function    Baseline  Pt with RT BKA, and fused Left knee, strength 3- to 4/5 in Bil LE  See chart    Time  5    Period  Weeks    Status  New    Target Date  02/27/19      PT LONG TERM GOAL #3   Title  Pt will tolerate sitting 1 hour without increased pain to ride in car / public transportation without increased pain    Baseline  Pt is able to sit for 30 minutes now in seat with minimal increase in pain    Time  5    Period  Weeks    Status  New    Target Date  02/27/19      PT LONG TERM GOAL #4   Title  pt with be able to walk/stand >/= 1 hour with no AD with </= 2/10 pain for functional endurance and return to leisure activities post DC    Baseline  Pt can only tolerate 5-10  minutes standing    Time  5    Period  Weeks    Status  New    Target Date  02/27/19      PT LONG TERM GOAL #5   Title  Pt will be aware of fall prevention strategies and verbalize at RX session    Baseline  needs reinforcement and possilble AD for prevention of falls    Time  5  Period  Weeks    Status  New    Target Date  02/27/19       Access Code: [email or web address]  URL: [email or web address]  Date: 01/30/2019  Prepared by: Voncille Lo   Exercises  Seated Quadratus Lumborum Stretch with Forward Bend - 3-5 reps -  1 sets - 20-30 sec hold - 1x daily - 7x weekly  Seated Quadratus Lumborum Stretch with Arm Overhead - 3-5 reps - 1 sets - 20-30 sec hold - 1x daily - 7x weekly  Seated Thoracic Lumbar Extension with Pectoralis Stretch - 3-5 reps - 1 sets - 20-30 sec hold - 1x daily - 7x weekly  Seated Lumbar Flexion Stretch - 3-5 reps - 1 sets - 20-30 hold - 1x daily - 7x weekly  Prone Hip Extension on Table - 15 reps - 3 sets - 1x daily - 7x weekly  Supine Straight Leg Raises - 15 reps - 3 sets - 1x daily - 7x weekly  Sidelying Hip Abduction - 15 reps - 3 sets - 1x daily - 7x weekly        Plan - 01/30/19 1709    Clinical Impression Statement  Mr Bady has attended eval and 2 visits due to transportation issues/kidney stones.  He has achieved allt STG's and would benefit form additional visits to strengthen and prevent falls.  Mr Vonbehren has been given assistance with transportation due to difficulty ambulating with LT BKA and fused RT knee as well as congenital hip issues.  He has had falls recently especially when ascending large step for public bus. He is compiant and diligent with exericises given and has decreased 5 x STS to 12.55 sec from 13.01 ( increase in LE strength) and is now a 5/10 pain.  He will greatly benefit from 2 x a week for additional 4 weeks to maximize function and prevent falls.    Personal Factors and Comorbidities  Comorbidity 1;Comorbidity 2    Comorbidities  limited AROM of RT knee 10 degrees of flex from birth.  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, RT hip congenital defect    Examination-Activity Limitations  Locomotion Level;Stairs;Transfers    Examination-Participation Restrictions  Community Activity    Stability/Clinical Decision Making  Stable/Uncomplicated    Clinical Decision Making  Low    Rehab Potential  Good    PT Frequency  2x / week    PT Duration  4 weeks    PT Treatment/Interventions  Cryotherapy;Electrical Stimulation;Iontophoresis 46m/ml Dexamethasone;Moist  Heat;Traction;Ultrasound;Gait training;Stair training;Therapeutic activities;Therapeutic exercise;Neuromuscular re-education;Manual techniques;Patient/family education;Passive range of motion;Dry needling;Taping;Joint Manipulations    PT Next Visit Plan  MCD 2nd authorization on Dec 1  For after 02-04-19 and on   PT Home Exercise Plan  modified seated knee to chest pelvic press and UE T< W< M Y, hip ext standing and leaning on table, hip abd, SLR,  QL stretching modified in seat and side to side    Consulted and Agree with Plan of Care  Patient       Patient will benefit from skilled therapeutic intervention in order to improve the following deficits and impairments:  Pain, Abnormal gait, Decreased mobility, Decreased range of motion, Decreased strength, Increased fascial restricitons, Increased muscle spasms, Postural dysfunction, Improper body mechanics  Visit Diagnosis: Pain in thoracic spine  Muscle weakness (generalized)  Other abnormalities of gait and mobility  Chronic bilateral low back pain with sciatica, sciatica laterality unspecified     Problem List Patient Active Problem List  Diagnosis Date Noted  . Subluxation of patella, acquired, left, initial encounter 02/19/2016  . 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 ADD (attention deficit disorder) 02/19/2016    Voncille Lo, PT Certified Exercise Expert for the Aging Adult  01/30/19 5:17 PM Phone: [phone] Fax: [facility] [address] [place], [place], [address] Phone: [phone]   Fax:  [phone]  Name: Joseph Bond MRN: [id number] Date of Birth: June 10, 1985

## 2019-02-06 ENCOUNTER — Ambulatory Visit: Payer: Medicaid Other | Admitting: Physical Therapy

## 2019-02-06 ENCOUNTER — Encounter: Payer: Self-pay | Admitting: Physical Therapy

## 2019-02-06 ENCOUNTER — Other Ambulatory Visit: Payer: Self-pay

## 2019-02-06 DIAGNOSIS — G8929 Other chronic pain: Secondary | ICD-10-CM

## 2019-02-06 DIAGNOSIS — R2689 Other abnormalities of gait and mobility: Secondary | ICD-10-CM

## 2019-02-06 DIAGNOSIS — M6281 Muscle weakness (generalized): Secondary | ICD-10-CM

## 2019-02-06 DIAGNOSIS — M5440 Lumbago with sciatica, unspecified side: Secondary | ICD-10-CM

## 2019-02-06 DIAGNOSIS — M546 Pain in thoracic spine: Secondary | ICD-10-CM

## 2019-02-06 NOTE — Therapy (Signed)
[place], [place], [address] Phone: [phone]   Fax:  [phone]  Physical Therapy Treatment  Patient Details  Name: Joseph Bond MRN: [id number] Date of Birth: 1985/04/05 Referring Provider (PT): Jolinda Croak MD   Encounter Date: 02/06/2019  PT End of Session - 02/06/19 1458    Visit Number  4    Number of Visits  4    Date for PT Re-Evaluation  02/27/19    Authorization Type  POC for recert is through [id number]  MCD 2nd authorization not recieved today.  Sent additional information to [employer]    PT Start Time  1502    PT Stop Time  1544    PT Time Calculation (min)  42 min    Activity Tolerance  Patient tolerated treatment well    Behavior During Therapy  [facility] for tasks assessed/performed       Past Medical History:  Diagnosis Date  . Asthma    childhood asthma    Past Surgical History:  Procedure Laterality Date  . BELOW KNEE LEG AMPUTATION      There were no vitals filed for this visit.  Subjective Assessment - 02/06/19 1503    Subjective  I am hoping to get stronger.  I was in a fall at [facility] on [place] Summer 2020.  my back increased in pain since that increased in pain since incident so I am here to get as strong and painfree as I can    Pertinent History  limited AROM of RT knee 10 degrees of flex from birth.  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  sit for an hour with pillow prop    How long can you stand comfortably?  10-15 minutes    How long can you walk comfortably?  10-15 minutes    Diagnostic tests  MRI- no traumatic findings    Patient Stated Goals  I want to have pain controlled.    Currently in Pain?  Yes    Pain Score  7     Pain Location  Back    Pain Orientation  Right;Left    Pain Descriptors / Indicators  Aching;Stabbing    Pain Type  Chronic  pain    Pain Onset  More than a month ago    Pain Frequency  Intermittent    Aggravating Factors   physical activity and quick sharp movements         [facility] PT Assessment - 02/06/19 0001      Observation/Other Assessments   Skin Integrity  Pt with LT BKA and signs of wear and redness, will need to monitor and send to orthotist/rehab specialist for BKA if standard care does not improve      Posture/Postural Control   Posture Comments  uneven pelvic levels.  LT BKA patella  2 cm more superior than LT patella on fused knee                   [facility] Adult PT Treatment/Exercise - 02/06/19 0001      Ambulation/Gait   Stairs  Yes    Stair Management Technique  Two rails;Alternating pattern;Other (comment)   Pt ascends with RT fused LE/ descends with L LE leading   Number of Stairs  20    Height of Stairs  6  Gait Comments  Pt uses bil UE support for safety      Lumbar Exercises: Stretches   Other Lumbar Stretch Exercise  --   added to HEP   Other Lumbar Stretch Exercise  seated low back Quadratus lumborum side stretch in seat  to RT and LT 2 x 30 sec each  and also bending forward and to  Rt and LT 2 x 30 sec each  thoracic stretch in chair 30 sec x 3      Lumbar Exercises: Prone   Single Arm Raises Limitations  prone press up x 5     Other Prone Lumbar Exercises  pelvic press 5 sec hold, then Pelvic press with LT knee flex x 10, then bil x 10 hip extension with pelvic press,  then Left knee flex with hip ext.         Knee/Hip Exercises: Standing   Other Standing Knee Exercises  on 6 inch step RT hip drop 2 x 10, and LT hip drop off step 2 x 10 with support from PT for balance      Knee/Hip Exercises: Prone   Hip Extension  Strengthening;3 sets;10 reps;Right;Left    Hip Extension Limitations  pt leaning on pillows on plinth    Other Prone Exercises  prone plank 15 sec hold x 2, LT side plank 10 sec x 2, RT side plank 23 sec and 15 sec                PT Short  Term Goals - 01/30/19 1507      PT SHORT TERM GOAL #1   Title  Pt will be independent with initial HEP    Baseline  given HEP for home and pt can demo    Time  3    Period  Weeks    Status  Achieved      PT SHORT TERM GOAL #2   Title  Pt will be able to sit for 30 minutes comfortably    Baseline  can sit and watch show for an hour and uses pillow to support    Time  3    Period  Weeks    Status  Achieved      PT SHORT TERM GOAL #3   Title  Improve mobility to perform sit to stand with minimal UE support    Baseline  Pt was able to perform 5 x STS in 12.55 sec using UE  minimal increase in pain    Time  3    Period  Weeks    Status  Partially Met      PT SHORT TERM GOAL #4   Title  Demonstrate 4/5 bil  LE strength to imporve stability, safety and endurance of community level function    Baseline  Pt with RT BKA, and fused Left knee, strength 3- to 4/5 in Bil LE  See chart    Time  3    Period  Weeks    Status  On-going        PT Long Term Goals - 01/23/19 1523      PT LONG TERM GOAL #1   Title  Pt will be independent with advanced HEP    Baseline  only has initial HEP    Time  5    Period  Weeks    Status  New    Target Date  02/27/19      PT LONG TERM GOAL #2   Title  Demonstrate  4/5 bil  LE strength to imporve stability, safety and endurance of community level function    Baseline  Pt with RT BKA, and fused Left knee, strength 3- to 4/5 in Bil LE  See chart    Time  5    Period  Weeks    Status  New    Target Date  02/27/19      PT LONG TERM GOAL #3   Title  Pt will tolerate sitting 1 hour without increased pain to ride in car / public transportation without increased pain    Baseline  Pt is able to sit for 30 minutes now in seat with minimal increase in pain    Time  5    Period  Weeks    Status  New    Target Date  02/27/19      PT LONG TERM GOAL #4   Title  pt with be able to walk/stand >/= 1 hour with no AD with </= 2/10 pain for functional endurance  and return to leisure activities post DC    Baseline  Pt can only tolerate 5-10  minutes standing    Time  5    Period  Weeks    Status  New    Target Date  02/27/19      PT LONG TERM GOAL #5   Title  Pt will be aware of fall prevention strategies and verbalize at RX session    Baseline  needs reinforcement and possilble AD for prevention of falls    Time  5    Period  Weeks    Status  New    Target Date  02/27/19            Plan - 02/06/19 1603    Clinical Impression Statement  Joseph Bond has bil LE weakness due to biomechanical /congenital defects. He enters with 7/10 back pain mid back.   He recently had fall at [facility] in July 2020 and is currently recieving care for increasing back pain since this summer.  Pt would like to return to function PLOF fall. He is currently performing exericises given and is very compliant with current program and is eager to learn more.  Will continue after he is authorized more visit.  Pt has a flattened LT acetabulum and flattened LT ball with no femoral neck congenital defect.  RT fused knee and uneven pelvic levels. Joseph Bond is appreciative of all the strengthening he can recieve to return to PLOF befoMr. Ajmal Bond has and LT BKA with some sign of improper fitting of BKA and redness.  I would recommend seeing an amputee specialist and continue strengthening and care once he finishes initial strengthening program or be able to transfer to rehab amputee specialist regarding falls. Pt is compensating with extra trunk rotation due to weakness and would benefit from targeted strengthening for safety and fall prevention.    Personal Factors and Comorbidities  Comorbidity 1;Comorbidity 2    Comorbidities  limited AROM of RT knee 10 degrees of flex from birth.  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, RT hip congenital defect    Examination-Activity Limitations  Locomotion Level;Stairs;Transfers    Examination-Participation Restrictions  Community  Activity    Stability/Clinical Decision Making  Stable/Uncomplicated    Clinical Decision Making  Low    Rehab Potential  Good    PT Frequency  2x / week    PT Treatment/Interventions  Cryotherapy;Electrical Stimulation;Iontophoresis 61m/ml Dexamethasone;Moist Heat;Traction;Ultrasound;Gait  training;Stair training;Therapeutic activities;Therapeutic exercise;Neuromuscular re-education;Manual techniques;Patient/family education;Passive range of motion;Dry needling;Taping;Joint Manipulations    PT Next Visit Plan  MCD 2nd authorization on Dec 1       Patient will benefit from skilled therapeutic intervention in order to improve the following deficits and impairments:  Pain, Abnormal gait, Decreased mobility, Decreased range of motion, Decreased strength, Increased fascial restricitons, Increased muscle spasms, Postural dysfunction, Improper body mechanics  Visit Diagnosis: Pain in thoracic spine  Muscle weakness (generalized)  Other abnormalities of gait and mobility  Chronic bilateral low back pain with sciatica, sciatica laterality unspecified     Problem List Patient Active Problem List   Diagnosis Date Noted  . Subluxation of patella, acquired, left, initial encounter 02/19/2016  . 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 ADD (attention deficit disorder) 02/19/2016    Voncille Lo, PT Certified Exercise Expert for the Aging Adult  02/06/19 4:51 PM Phone: [phone] Fax: [place] [facility] [address] [place], [place], [address] Phone: [phone]   Fax:  [phone]  Name: Ernst Cumpston MRN: [id number] Date of Birth: 12-10-1985

## 2019-02-08 ENCOUNTER — Ambulatory Visit: Payer: Medicaid Other | Admitting: Physical Therapy

## 2019-02-11 ENCOUNTER — Emergency Department (HOSPITAL_COMMUNITY): Payer: Medicaid Other

## 2019-02-11 ENCOUNTER — Other Ambulatory Visit: Payer: Self-pay

## 2019-02-11 ENCOUNTER — Emergency Department (HOSPITAL_COMMUNITY)
Admission: EM | Admit: 2019-02-11 | Discharge: 2019-02-11 | Disposition: A | Discharge status: Home / Self Care | Payer: Medicaid Other | Attending: Emergency Medicine | Admitting: Emergency Medicine

## 2019-02-11 DIAGNOSIS — Z79899 Other long term (current) drug therapy: Secondary | ICD-10-CM | POA: Insufficient documentation

## 2019-02-11 DIAGNOSIS — W269XXA Contact with unspecified sharp object(s), initial encounter: Secondary | ICD-10-CM | POA: Insufficient documentation

## 2019-02-11 DIAGNOSIS — Z23 Encounter for immunization: Secondary | ICD-10-CM | POA: Insufficient documentation

## 2019-02-11 DIAGNOSIS — Y9389 Activity, other specified: Secondary | ICD-10-CM | POA: Diagnosis not present

## 2019-02-11 DIAGNOSIS — S61411A Laceration without foreign body of right hand, initial encounter: Secondary | ICD-10-CM | POA: Insufficient documentation

## 2019-02-11 DIAGNOSIS — J45909 Unspecified asthma, uncomplicated: Secondary | ICD-10-CM | POA: Insufficient documentation

## 2019-02-11 DIAGNOSIS — Y929 Unspecified place or not applicable: Secondary | ICD-10-CM | POA: Insufficient documentation

## 2019-02-11 DIAGNOSIS — Y999 Unspecified external cause status: Secondary | ICD-10-CM | POA: Insufficient documentation

## 2019-02-11 DIAGNOSIS — F17210 Nicotine dependence, cigarettes, uncomplicated: Secondary | ICD-10-CM | POA: Diagnosis not present

## 2019-02-11 MED ORDER — TETANUS-DIPHTH-ACELL PERTUSSIS 5-2.5-18.5 LF-MCG/0.5 IM SUSP
0.5000 mL | Freq: Once | INTRAMUSCULAR | Status: AC
  Administered 2019-02-11: 0.5 mL via INTRAMUSCULAR
  Filled 2019-02-11: qty 0.5

## 2019-02-11 MED ORDER — LIDOCAINE-EPINEPHRINE 1 %-1:100000 IJ SOLN
10.0000 mL | Freq: Once | INTRAMUSCULAR | Status: AC
  Administered 2019-02-11: 10 mL via INTRADERMAL
  Filled 2019-02-11: qty 10

## 2019-02-11 MED ORDER — BACITRACIN ZINC 500 UNIT/GM EX OINT: 1.0000 "application " | TOPICAL_OINTMENT | Freq: Two times a day (BID) | CUTANEOUS | 0 refills | Status: DC

## 2019-02-11 NOTE — ED Provider Notes (Signed)
..  Laceration Repair  Date/Time: 02/11/2019 5:36 PM Performed by: Jacqlyn Larsen, PA-C Authorized by: Jacqlyn Larsen, PA-C   Consent:    Consent obtained:  Verbal   Consent given by:  Patient   Risks discussed:  Infection, pain, poor cosmetic result, poor wound healing and need for additional repair   Alternatives discussed:  No treatment Anesthesia (see MAR for exact dosages):    Anesthesia method:  Local infiltration   Local anesthetic:  Lidocaine 1% WITH epi Laceration details:    Location:  Finger   Finger location:  R thumb (Horizontal laceration at the very base of the right thumb)   Length (cm):  2.5   Depth (mm):  5 Repair type:    Repair type:  Simple Pre-procedure details:    Preparation:  Patient was prepped and draped in usual sterile fashion and imaging obtained to evaluate for foreign bodies Exploration:    Hemostasis achieved with:  Direct pressure   Wound exploration: wound explored through full range of motion and entire depth of wound probed and visualized     Wound extent: areolar tissue violated     Wound extent: no foreign bodies/material noted and no underlying fracture noted   Treatment:    Area cleansed with:  Saline   Amount of cleaning:  Standard   Irrigation solution:  Sterile saline   Irrigation method:  Syringe Skin repair:    Repair method:  Sutures   Suture size:  4-0   Suture material:  Prolene   Suture technique:  Simple interrupted   Number of sutures:  4 Approximation:    Approximation:  Close Post-procedure details:    Dressing:  Bulky dressing   Patient tolerance of procedure:  Tolerated well, no immediate complications Comments:     Laceration repaired by PA student with my direct supervision      Jacqlyn Larsen, PA-C 02/11/19 [place], Ankit, MD 02/11/19 [phone]

## 2019-02-11 NOTE — ED Provider Notes (Signed)
[facility] DEPT Provider Note   CSN: [id number] Arrival date & time: 02/11/19  1616     History Chief Complaint  Patient presents with  . Laceration    Joseph Bond is a 33 y.o. male.  HPI    33 year old male comes with a chief complaint of laceration. Patient cut himself with a glass while trying to open a can in his kitchen. He had bleeding that he was able to control with pressure dressing.  Patient is not up-to-date with his tetanus.  He has no numbness, tingling.  No significant pain at this time.  He is immunocompetent.  Past Medical History:  Diagnosis Date  . Asthma    childhood asthma    Patient Active Problem List   Diagnosis Date Noted  . Subluxation of patella, acquired, left, initial encounter 02/19/2016  . 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 ADD (attention deficit disorder) 02/19/2016    Past Surgical History:  Procedure Laterality Date  . BELOW KNEE LEG AMPUTATION         No family history on file.  Social History   Tobacco Use  . Smoking status: Current Every Day Smoker    Packs/day: 0.50    Types: Cigarettes  . Smokeless tobacco: Current User  Substance Use Topics  . Alcohol use: Yes    Alcohol/week: 3.0 standard drinks    Types: 1 Glasses of wine, 1 Cans of beer, 1 Shots of liquor per week    Comment: social  . Drug use: Yes    Types: "Crack" cocaine, Cocaine    Home Medications Prior to Admission medications   Medication Sig Start Date End Date Taking? Authorizing Provider  DULoxetine (CYMBALTA) 20 MG capsule TK ONE C PO  BID 11/25/17   [provider]  lidocaine (LIDODERM) 5 % Place 1 patch onto the skin daily. Remove & Discard patch within 12 hours or as directed by MD 09/21/18   Henderly, Britni A, PA-C  mirtazapine (REMERON) 15 MG tablet Take 15 mg by mouth at bedtime. Take one half tablet at bedtime    [provider]     Allergies    Penicillins  Review of Systems   Review of Systems  Constitutional: Positive for activity change.  Skin: Positive for wound.  Allergic/Immunologic: Negative for immunocompromised state.  Neurological: Negative for numbness.    Physical Exam Updated Vital Signs BP 140/86 (BP Location: Left Arm)   Pulse 92   Temp 98.2 F (36.8 C) (Oral)   Resp 16   SpO2 98%   Physical Exam Vitals and nursing note reviewed.  Constitutional:      Appearance: He is well-developed.  HENT:     Head: Atraumatic.  Cardiovascular:     Rate and Rhythm: Normal rate.  Pulmonary:     Effort: Pulmonary effort is normal.  Musculoskeletal:     Comments: About 2 cm laceration at the thumb webspace. No bleeding. Patient is able to flex, extend, oppose, adduct and abduct his thumb without difficulty. Gross sensory exam is normal.  Skin:    General: Skin is warm.  Neurological:     Mental Status: He is alert and oriented to person, place, and time.     ED Results / Procedures / Treatments   Labs (all labs ordered are listed, but only abnormal results are displayed) Labs Reviewed - No data to display  EKG None  Radiology DG Hand Complete Right  Result Date: 02/11/2019 CLINICAL DATA:  Patient reports to the ED with c/o laceration to anterior base of right thumb. Patient reports he cut it with broken glass trying to open a can. EXAM: RIGHT HAND - COMPLETE 3+ VIEW COMPARISON:  None. FINDINGS: There is no evidence of fracture or dislocation. There is no evidence of arthropathy or other focal bone abnormality. Soft tissues defect at the base of the right thumb without a radiopaque foreign body. IMPRESSION: No acute bony abnormality. No radiopaque foreign body is evident. Electronically Signed   By: Emmaline Kluver M.D.   On: 02/11/2019 16:55    Procedures Procedures (including critical care time)  Medications Ordered in ED Medications  Tdap (BOOSTRIX) injection 0.5 mL (has no  administration in time range)  lidocaine-EPINEPHrine (XYLOCAINE W/EPI) 1 %-1:100000 (with pres) injection 10 mL (has no administration in time range)    ED Course  I have reviewed the triage vital signs and the nursing notes.  Pertinent labs & imaging results that were available during my care of the patient were reviewed by me and considered in my medical decision making (see chart for details).    MDM Rules/Calculators/A&P      33 year old comes in a chief complaint of laceration to the webspace. Tetanus updated. Wound was cleaned and repaired in the ED. Follow-up with PCP in 7 days for suture removal.  Wound care instructions along with prescription for bacitracin provided.  ER return precautions also discussed.                   Final Clinical Impression(s) / ED Diagnoses Final diagnoses:  Laceration of right hand without foreign body, initial encounter    Rx / DC Orders ED Discharge Orders    None       Derwood Kaplan, MD 02/11/19 1746

## 2019-02-11 NOTE — ED Triage Notes (Signed)
Patient reports to the ED with c/o laceration to right hand. Patient reports he cut it with broken glass trying to open a can.

## 2019-02-11 NOTE — Discharge Instructions (Signed)
We saw you in the ER for your WOUND. °Please read the instructions provided on wound care. °Keep the area clean and dry, apply bacitracin ointment daily and take the medications provided. °RETURN TO THE ER IF THERE IS INCREASED PAIN, REDNESS, PUS COMING OUT from the wound site.  °

## 2019-02-13 ENCOUNTER — Ambulatory Visit: Payer: Medicaid Other | Admitting: Physical Therapy

## 2019-02-15 ENCOUNTER — Ambulatory Visit (INDEPENDENT_AMBULATORY_CARE_PROVIDER_SITE_OTHER): Payer: Medicaid Other | Admitting: Orthopedic Surgery

## 2019-02-15 ENCOUNTER — Ambulatory Visit (INDEPENDENT_AMBULATORY_CARE_PROVIDER_SITE_OTHER): Payer: Medicaid Other

## 2019-02-15 ENCOUNTER — Ambulatory Visit: Payer: Medicaid Other | Admitting: Physical Therapy

## 2019-02-15 ENCOUNTER — Encounter: Payer: Self-pay | Admitting: Orthopedic Surgery

## 2019-02-15 ENCOUNTER — Other Ambulatory Visit: Payer: Self-pay

## 2019-02-15 VITALS — Ht 66.0 in | Wt 125.0 lb

## 2019-02-15 DIAGNOSIS — M25551 Pain in right hip: Secondary | ICD-10-CM | POA: Diagnosis not present

## 2019-02-15 DIAGNOSIS — M25561 Pain in right knee: Secondary | ICD-10-CM | POA: Diagnosis not present

## 2019-02-15 DIAGNOSIS — G8929 Other chronic pain: Secondary | ICD-10-CM

## 2019-02-16 ENCOUNTER — Encounter: Payer: Self-pay | Admitting: Orthopedic Surgery

## 2019-02-16 NOTE — Progress Notes (Signed)
Office Visit Note   Patient: Joseph Bond           Date of Birth: November 18, 1985           MRN: [id number] Visit Date: 02/15/2019              Requested by: Charlott Rakes, MD [place],  [place] [address] PCP: Charlott Rakes, MD  Chief Complaint  Patient presents with  . Left Knee - Pain, New Patient (Initial Visit)  . 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  Patient states he has pain with weightbearing at all times in the right knee, left transtibial amputation and left hip.  Patient states that he has been recommended to proceed with bilateral above-the-knee amputations.  Assessment & Plan: Visit Diagnoses:  1. Chronic pain of right knee   2. Pain in right hip     Plan: Plan: Discussed with the patient his deformity and inability to weight-bear on the left transtibial amputation he should proceed with a left above-knee amputation at this time.  Discussed that once this heals and he is able ambulate with a prosthesis I would recommend proceeding with a right above-the-knee amputation as well patient has extensive soft tissue incisions around the right knee and does not have any salvage options for than the and his best option would be to proceed with a amputation above the right knee as opposed to a knee effusion due to the deformity and equinus contracture of the right foot.  We will call patient to set up surgery on the left.  Patient is currently going to school.  Patient currently has a transtibial amputation from [employer]otech and he will follow-up with biotech for his above-the-knee  prosthesis.  Follow-Up Instructions: Return in about 1 week (around 02/22/2019) for Follow-up 1 week postoperatively.Manson Passey Exam  Patient is alert, oriented, no adenopathy, well-dressed, normal affect, normal respiratory effort. Examination patient has an antalgic gait he has no range of motion of the right knee its fixed and approximately 20 degrees of flexion he has global tenderness to palpation and to weightbearing.  Patient has deformity to the right foot with equinus contracture and does not have a plantigrade foot.  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  His left leg is thin and atrophic.  The patella dislocates laterally and patient states he has had multiple falls secondary to dislocation of the patella within his prosthesis.  Patient has essentially no range of motion of the left hip.  There is also essentially no range of motion of the right hip.  Patient states that his pain in the right knee is 8 out of 10.  Imaging: No results found. No images are attached to the encounter.  Labs: No results found for: HGBA1C, ESRSEDRATE, CRP, LABURIC, REPTSTATUS, GRAMSTAIN, CULT, LABORGA   Lab Results  Component Value Date   ALBUMIN 4.6 04/29/2018    No results found for: MG No results found for: VD25OH  No results found for: PREALBUMIN CBC EXTENDED Latest Ref Rng & Units 04/29/2018  WBC  4.0 - 10.5 K/uL 7.4  RBC 4.22 - 5.81 MIL/uL 5.32  HGB 13.0 - 17.0 g/dL 34.1  HCT 93.7 - 90.2 % 50.9  PLT 150 - 400 K/uL 298     Body mass index is 20.18 kg/m.  Orders:  Orders Placed This Encounter  Procedures  . XR Knee 1-2 Views Right  . XR Pelvis 1-2 Views   No orders of the defined types were placed in this encounter.    Procedures: No procedures performed  Clinical Data: No additional findings.  ROS:  All other systems negative, except as noted in the HPI. Review of  Systems  Objective: Vital Signs: Ht 5\' 6"  (1.676 m)   Wt 125 lb (56.7 kg)   BMI 20.18 kg/m   Specialty Comments:  No specialty comments available.  PMFS History: Patient Active Problem List   Diagnosis Date Noted  . Subluxation of patella, acquired, left, initial encounter 02/19/2016  . 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 ADD (attention deficit disorder) 02/19/2016   Past Medical History:  Diagnosis Date  . Asthma    childhood asthma    History reviewed. No pertinent family history.  Past Surgical History:  Procedure Laterality Date  . BELOW KNEE LEG AMPUTATION     Social History   Occupational History  . Not on file  Tobacco Use  . Smoking status: Current Every Day Smoker    Packs/day: 0.50    Types: Cigarettes  . Smokeless tobacco: Current User  Substance and Sexual Activity  . Alcohol use: Yes    Alcohol/week: 3.0 standard drinks    Types: 1 Glasses of wine, 1 Cans of beer, 1 Shots of liquor per week    Comment: social  . Drug use: Yes    Types: "Crack" cocaine, Cocaine  . Sexual activity: Not on file

## 2019-02-19 ENCOUNTER — Ambulatory Visit: Payer: Medicaid Other | Admitting: Physical Therapy

## 2019-02-26 ENCOUNTER — Ambulatory Visit: Payer: Medicaid Other | Admitting: Physical Therapy

## 2019-02-27 ENCOUNTER — Encounter: Payer: Self-pay | Admitting: Physical Therapy

## 2019-02-27 ENCOUNTER — Ambulatory Visit: Payer: Medicaid Other | Admitting: Physical Therapy

## 2019-02-27 ENCOUNTER — Other Ambulatory Visit: Payer: Self-pay

## 2019-02-27 DIAGNOSIS — M6281 Muscle weakness (generalized): Secondary | ICD-10-CM

## 2019-02-27 DIAGNOSIS — R2689 Other abnormalities of gait and mobility: Secondary | ICD-10-CM

## 2019-02-27 DIAGNOSIS — M546 Pain in thoracic spine: Secondary | ICD-10-CM | POA: Diagnosis not present

## 2019-02-27 DIAGNOSIS — G8929 Other chronic pain: Secondary | ICD-10-CM

## 2019-02-27 DIAGNOSIS — M5440 Lumbago with sciatica, unspecified side: Secondary | ICD-10-CM

## 2019-02-27 NOTE — Therapy (Signed)
[place], [place], [address] Phone: [phone]   Fax:  [phone]  Physical Therapy Treatment/Discharge Note  Patient Details  Name: Joseph Bond MRN: [id number] Date of Birth: 09/28/1985 Referring Provider (PT): Jolinda Croak MD   Encounter Date: 02/27/2019  PT End of Session - 02/27/19 1148    Visit Number  5    Number of Visits  10    Date for PT Re-Evaluation  02/27/19    Authorization Type  POC for recert is through [id number]  MCD 2nd authorization not recieved today.  Sent additional information to [employer]    PT Start Time  1145    PT Stop Time  1230    PT Time Calculation (min)  45 min    Activity Tolerance  Patient tolerated treatment well    Behavior During Therapy  WFL for tasks assessed/performed       Past Medical History:  Diagnosis Date  . Asthma    childhood asthma    Past Surgical History:  Procedure Laterality Date  . BELOW KNEE LEG AMPUTATION      There were no vitals filed for this visit.  Subjective Assessment - 02/27/19 1149    Subjective  my back pain is doing better.  I am concerned about my other issues with  my knee and patella subluxing.  I saw Dr Sharol Given and I may need other surgery in the future.  My back is doing well and I am good with my exercise.    Pertinent History  limited AROM of RT knee 10 degrees of flex from birth.  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  I can sit 1-2 hours with a pillow for support    How long can you stand comfortably?  15 minutes.  limited by other LE pain , not back    How long can you walk comfortably?  10-15 minutes due to congenital issues    Diagnostic tests  MRI- no traumatic findings of back    Patient Stated Goals  I want to have pain controlled.    Currently in Pain?  Yes    Pain Score  0-No pain    Pain Location  Back     Pain Orientation  Right;Left    Pain Onset  More than a month ago    Aggravating Factors   Pt with RT LE fused knee and LT BKA, LT congenital flattened acetabulum/hip joint    Multiple Pain Sites  Yes    Pain Score  8    Pain Location  Knee    Pain Orientation  Right    Pain Descriptors / Indicators  Aching    Pain Type  Chronic pain    Pain Score  6    Pain Location  Foot    Pain Orientation  Right    Pain Descriptors / Indicators  Aching    Pain Type  Chronic pain         [facility] PT Assessment - 02/27/19 0001      Observation/Other Assessments   Skin Integrity  Pt with LT BKA and signs of wear and redness, will need to monitor and send to orthotist/rehab specialist for BKA if standard care does not improve      AROM   Overall AROM   Deficits    Overall AROM  Comments  RT knee fused 10 degrees,  LT BKA    Lumbar Flexion  75%    Lumbar Extension  50%    Lumbar - Right Side Bend  50 % available    Lumbar - Left Side Bend  75 % available    Lumbar - Right Rotation  50% available range    Lumbar - Left Rotation  50% available   pain limiting   Thoracic Flexion  75%    Thoracic Extension  50% available range    Thoracic - Right Side Bend  50%     Thoracic - Left Side Bend  50%    Thoracic - Right Rotation  50%    Thoracic - Left Rotation  50%      Strength   Overall Strength  Deficits    Right Hip Flexion  4+/5    Right Hip Extension  4-/5    Right Hip ABduction  4-/5    Left Hip Flexion  4-/5    Left Hip Extension  4-/5    Left Hip ABduction  4-/5                   [facility] Adult PT Treatment/Exercise - 02/27/19 0001      Transfers   Five time sit to stand comments   13.05 sec   pt must use UE support due to congenital issues of LE's     Ambulation/Gait   Gait Comments  Pt uses bil UE support for safety      Posture/Postural Control   Posture Comments  uneven pelvic levels.  LT BKA patella  2 cm more superior than LT patella on fused knee       Self-Care   Self-Care  Other Self-Care Comments    Other Self-Care Comments   discussion with Jamey Reas PT, amputee specialist for communitiy resources and reviewed HEP for home use for control of back pain,        Lumbar Exercises: Stretches   Other Lumbar Stretch Exercise  seated Low back stretch     Other Lumbar Stretch Exercise  seated low back Quadratus lumborum side stretch in seat  to RT and LT 2 x 30 sec each  and also bending forward and to  Rt and LT 2 x 30 sec each  thoracic stretch in chair 30 sec x 3      Lumbar Exercises: Supine   Ab Set  10 reps    AB Set Limitations  10 sec    Straight Leg Raise  15 reps   x 2     Lumbar Exercises: Sidelying   Hip Abduction Weights (lbs)  2 x 10 RT sidelying and then LT sidelying     Hip Abduction Limitations  VC for keeping heel on wall for correct execution      Lumbar Exercises: Prone   Single Arm Raises Limitations  prone press up x 5     Other Prone Lumbar Exercises  pelvic press 5 sec hold, then Pelvic press with LT knee flex x 10, then bil x 10 hip extension with pelvic press,  then Left knee flex with hip ext.         Knee/Hip Exercises: Prone   Hip Extension  Strengthening;3 sets;10 reps;Right;Left    Hip Extension Limitations  pt leaning on pillows on plinth    Other Prone Exercises  prone plank   , side plank  trying 30 sec  at a time over 5  minute period               PT Short Term Goals - 01/30/19 1507      PT SHORT TERM GOAL #1   Title  Pt will be independent with initial HEP    Baseline  given HEP for home and pt can demo    Time  3    Period  Weeks    Status  Achieved      PT SHORT TERM GOAL #2   Title  Pt will be able to sit for 30 minutes comfortably    Baseline  can sit and watch show for an hour and uses pillow to support    Time  3    Period  Weeks    Status  Achieved      PT SHORT TERM GOAL #3   Title  Improve mobility to perform sit to stand with minimal UE support    Baseline  Pt was  able to perform 5 x STS in 12.55 sec using UE  minimal increase in pain    Time  3    Period  Weeks    Status  Partially Met      PT SHORT TERM GOAL #4   Title  Demonstrate 4/5 bil  LE strength to imporve stability, safety and endurance of community level function    Baseline  Pt with RT BKA, and fused Left knee, strength 3- to 4/5 in Bil LE  See chart    Time  3    Period  Weeks    Status  On-going        PT Long Term Goals - 02/27/19 1624      PT LONG TERM GOAL #1   Title  Pt will be independent with advanced HEP    Baseline  Pt is independent with HEP for back and has no pain    Time  5    Period  Weeks    Status  Achieved      PT LONG TERM GOAL #2   Title  Demonstrate 4/5 bil  LE strength to imporve stability, safety and endurance of community level function    Baseline  Pt with RT BKA, and fused Left knee, strength RT hip flex 4+/5 all else 4-/5  in Bil LE Pt pursuing help from MD/surgically and with prosthetist for subluxing patella  See flowchart    Time  5    Period  Weeks    Status  Partially Met      PT LONG TERM GOAL #3   Title  Pt will tolerate sitting 1 hour without increased pain to ride in car / public transportation without increased pain    Baseline  Pt is able to sit and watch a movie at home without back pain    Time  5    Period  Weeks    Status  Achieved      PT LONG TERM GOAL #4   Title  pt with be able to walk/stand >/= 1 hour with no AD with </= 2/10 pain for functional endurance and return to leisure activities post DC    Baseline  Pt can only tolerate 15-20 minutes, due to congenital LE defects.  Back is not limiting factor at this time. Pt to pursue further medical work up    Period  Weeks    Status  Deferred      PT LONG TERM GOAL #5   Title  Pt  will be aware of fall prevention strategies and verbalize at RX session    Baseline  Pt is aware of fall prevention and has been sent to  MD/ prothetist for bracing.    Time  5    Status  Achieved             Plan - 02/27/19 1629    Clinical Impression Statement  Joseph Bond was present as was Jamey Reas DPT and amuputee speciallist present to offer community wellness and expertise concerning amputation current and potential future care. Joseph Bond has bil LE weakness due to biomechanical/congenital defects.  He has decreased his back pain to 0/10.  He has resolved his back pain with exericises but he continues to have falls due to subluxing patella on LT BKA. He has achieved or partilally met all LTG's regarding his back pain and is able to control with exercises.  Joseph Bond, however, is well motivated to care for himself but is limited by his congenital issues which contribute to falls and pain in lower extremities.  He may be able to preserve his LT BKA with bracing to prevent subluxation and has an appt for evaluation next week.  This would be beneficial for fall prevention.  He is pursuing advice from MD regarding his RT LE and pros and cons of amputation since he has pain and inability to bend knee.  Joseph Bond is well motivated to follow through on anything asked of him.  He has completed PT at our site but he will definitely benefit from neuor rehab with amputee specialist should he pursue any further amputations.  He will also benefit from evaulation for W/C since his LE defects limits his mobility and ambulation. Thank you for the privilege of serving Joseph Bond.  We wish him well as he pursues further work up and solutions for her deficits    Personal Factors and Comorbidities  Finances    Comorbidities  limited AROM of RT knee 10 degrees of flex from birth.  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, RT hip congenital defect    Examination-Activity Limitations  Locomotion Level;Stairs;Transfers    Examination-Participation Restrictions  Community Activity    Stability/Clinical Decision Making  Stable/Uncomplicated    Clinical Decision Making  Low    Rehab Potential  Good    PT Frequency   2x / week    PT Duration  4 weeks    PT Treatment/Interventions  Cryotherapy;Electrical Stimulation;Iontophoresis 13m/ml Dexamethasone;Moist Heat;Traction;Ultrasound;Gait training;Stair training;Therapeutic activities;Therapeutic exercise;Neuromuscular re-education;Manual techniques;Patient/family education;Passive range of motion;Dry needling;Taping;Joint Manipulations    PT Next Visit Plan  DC for back pain    PT Home Exercise Plan  modified seated knee to chest pelvic press and UE T< W< M Y, hip ext standing and leaning on table, hip abd, SLR,  QL stretching modified in seat and side to side    Consulted and Agree with Plan of Care  Patient       Patient will benefit from skilled therapeutic intervention in order to improve the following deficits and impairments:  Pain, Abnormal gait, Decreased mobility, Decreased range of motion, Decreased strength, Increased fascial restricitons, Increased muscle spasms, Postural dysfunction, Improper body mechanics  Visit Diagnosis: Pain in thoracic spine  Muscle weakness (generalized)  Other abnormalities of gait and mobility  Chronic bilateral low back pain with sciatica, sciatica laterality unspecified     Problem List Patient Active Problem List   Diagnosis Date Noted  . Subluxation of patella, acquired,  left, initial encounter 02/19/2016  . 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 ADD (attention deficit disorder) 02/19/2016    Voncille Lo, PT Certified Exercise Expert for the Aging Adult  02/27/19 4:56 PM Phone: [phone] Fax: [facility] [address] [place], [place], [address] Phone: [phone]   Fax:  [phone]  Name: Melissa Pulido MRN: [id number] Date of Birth: September 14, 1985   PHYSICAL THERAPY DISCHARGE SUMMARY  Visits from Start of Care: 5  Current functional level related to goals / functional  outcomes: See above   Remaining deficits: Falls due to LT knee subluxation, ( needs order for prosthetist and bracing)  Further work up for possible AKA amputation for RT LE , order for W/C for safer mobility at community level   Education / Equipment: HEP for control of back pain. Plan: Patient agrees to discharge.  Patient goals were partially met. Patient is being discharged due to meeting the stated rehab goals.  ?????    And independent with HEP for control of back pain. Voncille Lo, PT Certified Exercise Expert for the Aging Adult  02/27/19 4:56 PM Phone: [phone] Fax: [phone]

## 2019-03-19 ENCOUNTER — Ambulatory Visit: Payer: Medicaid Other | Admitting: Orthopedic Surgery

## 2019-04-09 ENCOUNTER — Other Ambulatory Visit: Payer: Self-pay

## 2019-04-09 ENCOUNTER — Ambulatory Visit (INDEPENDENT_AMBULATORY_CARE_PROVIDER_SITE_OTHER): Payer: Medicaid Other | Admitting: Orthopedic Surgery

## 2019-04-09 ENCOUNTER — Encounter: Payer: Self-pay | Admitting: Orthopedic Surgery

## 2019-04-09 VITALS — Ht 66.0 in | Wt 125.0 lb

## 2019-04-09 DIAGNOSIS — M25561 Pain in right knee: Secondary | ICD-10-CM

## 2019-04-09 DIAGNOSIS — G8929 Other chronic pain: Secondary | ICD-10-CM | POA: Diagnosis not present

## 2019-04-09 DIAGNOSIS — M2212 Recurrent subluxation of patella, left knee: Secondary | ICD-10-CM

## 2019-04-09 DIAGNOSIS — Z89512 Acquired absence of left leg below knee: Secondary | ICD-10-CM

## 2019-04-09 NOTE — Progress Notes (Signed)
Office Visit Note   Patient: Joseph Bond           Date of Birth: [birth year]           MRN: [id number] Visit Date: 04/09/2019              Requested by: Hoy Register, MD [address] [place],  [place] [address] PCP: Hoy Register, MD  No chief complaint on file.     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  Patient states that he is tried steroid injections without relief.  Patient will also has subluxation of the left patella he is going to therapy and has had 27 therapy visits for prosthetic training.     Assessment & Plan: Visit Diagnoses:  1. Chronic pain of right knee   2. Recurrent subluxation of patella, left knee   3. Left below-knee amputee [facility])     Plan: Patient is given a prescription for a locked knee brace for the right knee to see if this will unload the right knee.  Patient is also given a prescription for biotech for a hinged thigh sleeve on the left to see if this will help support his subluxing patella.  Patient states he is going to be moving into a new home in several months and may be able to consider surgical intervention at that time.  Follow-Up Instructions: Return in about 2 months (around 06/07/2019).   Ortho Exam  Patient is alert, oriented, no adenopathy, well-dressed, normal affect, normal respiratory effort. Examination the right knee patient has essentially a fused knee he has range of motion from 10 to 20 degrees he is globally tender to palpation he has multiple surgical incisions around the knee.  Radiographs shows an essentially fused in the.  Examination of the left knee he has subluxation of the patella.  He has an unstable gait with valgus alignment to the right knee.  Patient has equinus contracture of the right foot.  Imaging: No results found. No  images are attached to the encounter.  Labs: No results found for: HGBA1C, ESRSEDRATE, CRP, LABURIC, REPTSTATUS, GRAMSTAIN, CULT, LABORGA   Lab Results  Component Value Date   ALBUMIN 4.6 04/29/2018    No results found for: MG No results found for: VD25OH  No results found for: PREALBUMIN CBC EXTENDED Latest Ref Rng & Units 04/29/2018  WBC 4.0 - 10.5 K/uL 7.4  RBC 4.22 - 5.81 MIL/uL 5.32  HGB 13.0 - 17.0 g/dL 48.5  HCT 46.2 - 70.3 % 50.9  PLT 150 - 400 K/uL 298     Body mass index is 20.18 kg/m.  Orders:  No orders of the defined types were placed in this encounter.  No orders of the defined types were placed in this encounter.    Procedures: No procedures performed  Clinical Data: No additional findings.  ROS:  All other systems negative, except as noted in the HPI. Review of Systems  Objective: Vital Signs: Ht 5\' 6"  (1.676 m)   Wt 125 lb (56.7 kg)   BMI 20.18 kg/m   Specialty Comments:  No specialty comments available.  PMFS History: Patient Active Problem List   Diagnosis Date Noted  . Subluxation of patella, acquired, left, initial encounter 02/19/2016  . 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 ADD (  attention deficit disorder) 02/19/2016   Past Medical History:  Diagnosis Date  . Asthma    childhood asthma    History reviewed. No pertinent family history.  Past Surgical History:  Procedure Laterality Date  . BELOW KNEE LEG AMPUTATION     Social History   Occupational History  . Not on file  Tobacco Use  . Smoking status: Current Every Day Smoker    Packs/day: 0.50    Types: Cigarettes  . Smokeless tobacco: Current User  Substance and Sexual Activity  . Alcohol use: Yes    Alcohol/week: 3.0 standard drinks    Types: 1 Glasses of wine, 1 Cans of beer, 1 Shots of liquor per week    Comment: social  . Drug use: Yes    Types: "Crack" cocaine, Cocaine  . Sexual activity: Not on file

## 2019-06-07 ENCOUNTER — Ambulatory Visit: Payer: Medicaid Other | Admitting: Orthopedic Surgery

## 2019-08-02 ENCOUNTER — Other Ambulatory Visit: Payer: Self-pay

## 2019-08-02 ENCOUNTER — Emergency Department (HOSPITAL_COMMUNITY)
Admission: EM | Admit: 2019-08-02 | Discharge: 2019-08-02 | Disposition: A | Discharge status: Home / Self Care | Payer: Medicaid Other | Attending: Emergency Medicine | Admitting: Emergency Medicine

## 2019-08-02 ENCOUNTER — Encounter (HOSPITAL_COMMUNITY): Payer: Self-pay

## 2019-08-02 ENCOUNTER — Emergency Department (HOSPITAL_COMMUNITY): Payer: Medicaid Other

## 2019-08-02 DIAGNOSIS — J1282 Pneumonia due to coronavirus disease 2019: Secondary | ICD-10-CM | POA: Diagnosis not present

## 2019-08-02 DIAGNOSIS — U071 COVID-19: Secondary | ICD-10-CM | POA: Diagnosis not present

## 2019-08-02 DIAGNOSIS — R05 Cough: Secondary | ICD-10-CM | POA: Diagnosis present

## 2019-08-02 DIAGNOSIS — F17210 Nicotine dependence, cigarettes, uncomplicated: Secondary | ICD-10-CM | POA: Insufficient documentation

## 2019-08-02 DIAGNOSIS — R059 Cough, unspecified: Secondary | ICD-10-CM

## 2019-08-02 DIAGNOSIS — Z89512 Acquired absence of left leg below knee: Secondary | ICD-10-CM | POA: Insufficient documentation

## 2019-08-02 DIAGNOSIS — J45909 Unspecified asthma, uncomplicated: Secondary | ICD-10-CM | POA: Diagnosis not present

## 2019-08-02 LAB — URINALYSIS, ROUTINE W REFLEX MICROSCOPIC
Bacteria, UA: NONE SEEN
Bilirubin Urine: NEGATIVE
Glucose, UA: NEGATIVE mg/dL
Hgb urine dipstick: NEGATIVE
Ketones, ur: 80 mg/dL — AB
Leukocytes,Ua: NEGATIVE
Nitrite: NEGATIVE
Protein, ur: 100 mg/dL — AB
Specific Gravity, Urine: 1.025 (ref 1.005–1.030)
pH: 5 (ref 5.0–8.0)

## 2019-08-02 LAB — CBC WITH DIFFERENTIAL/PLATELET
Abs Immature Granulocytes: 0.02 10*3/uL (ref 0.00–0.07)
Basophils Absolute: 0 10*3/uL (ref 0.0–0.1)
Basophils Relative: 0 %
Eosinophils Absolute: 0 10*3/uL (ref 0.0–0.5)
Eosinophils Relative: 0 %
HCT: 47.1 % (ref 39.0–52.0)
Hemoglobin: 15.4 g/dL (ref 13.0–17.0)
Immature Granulocytes: 0 %
Lymphocytes Relative: 29 %
Lymphs Abs: 1.5 10*3/uL (ref 0.7–4.0)
MCH: 32 pg (ref 26.0–34.0)
MCHC: 32.7 g/dL (ref 30.0–36.0)
MCV: 97.9 fL (ref 80.0–100.0)
Monocytes Absolute: 0.7 10*3/uL (ref 0.1–1.0)
Monocytes Relative: 14 %
Neutro Abs: 2.9 10*3/uL (ref 1.7–7.7)
Neutrophils Relative %: 57 %
Platelets: 285 10*3/uL (ref 150–400)
RBC: 4.81 MIL/uL (ref 4.22–5.81)
RDW: 12.2 % (ref 11.5–15.5)
WBC: 5.1 10*3/uL (ref 4.0–10.5)
nRBC: 0 % (ref 0.0–0.2)

## 2019-08-02 LAB — BASIC METABOLIC PANEL
Anion gap: 21 — ABNORMAL HIGH (ref 5–15)
BUN: 7 mg/dL (ref 6–20)
CO2: 22 mmol/L (ref 22–32)
Calcium: 9 mg/dL (ref 8.9–10.3)
Chloride: 94 mmol/L — ABNORMAL LOW (ref 98–111)
Creatinine, Ser: 1.19 mg/dL (ref 0.61–1.24)
GFR calc Af Amer: >60 mL/min (ref >60)
GFR calc non Af Amer: >60 mL/min (ref >60)
Glucose, Bld: 70 mg/dL (ref 70–99)
Potassium: 3.9 mmol/L (ref 3.5–5.1)
Sodium: 137 mmol/L (ref 135–145)

## 2019-08-02 LAB — CK: Total CK: 137 U/L (ref 49–397)

## 2019-08-02 LAB — SARS CORONAVIRUS 2 BY RT PCR (HOSPITAL ORDER, PERFORMED IN ~~LOC~~ HOSPITAL LAB): SARS Coronavirus 2: POSITIVE — AB

## 2019-08-02 LAB — TROPONIN I (HIGH SENSITIVITY): Troponin I (High Sensitivity): 3 ng/L (ref <18)

## 2019-08-02 MED ORDER — BENZONATATE 100 MG PO CAPS
200.0000 mg | ORAL_CAPSULE | Freq: Once | ORAL | Status: AC
  Administered 2019-08-02: 200 mg via ORAL
  Filled 2019-08-02: qty 2

## 2019-08-02 MED ORDER — IBUPROFEN 400 MG PO TABS
600.0000 mg | ORAL_TABLET | Freq: Once | ORAL | Status: AC
  Administered 2019-08-02: 600 mg via ORAL
  Filled 2019-08-02: qty 1

## 2019-08-02 MED ORDER — ONDANSETRON HCL 4 MG/2ML IJ SOLN
4.0000 mg | Freq: Once | INTRAMUSCULAR | Status: AC
  Administered 2019-08-02: 4 mg via INTRAVENOUS
  Filled 2019-08-02: qty 2

## 2019-08-02 MED ORDER — ACETAMINOPHEN 500 MG PO TABS
1000.0000 mg | ORAL_TABLET | Freq: Once | ORAL | Status: AC
  Administered 2019-08-02: 1000 mg via ORAL
  Filled 2019-08-02: qty 2

## 2019-08-02 MED ORDER — BENZONATATE 100 MG PO CAPS: 100.0000 mg | ORAL_CAPSULE | Freq: Three times a day (TID) | ORAL | 0 refills | Status: AC

## 2019-08-02 MED ORDER — HYDROCOD POLST-CPM POLST ER 10-8 MG/5ML PO SUER
5.0000 mL | Freq: Once | ORAL | Status: AC
  Administered 2019-08-02: 5 mL via ORAL
  Filled 2019-08-02: qty 5

## 2019-08-02 MED ORDER — ONDANSETRON 4 MG PO TBDP: 4.0000 mg | ORAL_TABLET | Freq: Three times a day (TID) | ORAL | 0 refills | Status: AC | PRN

## 2019-08-02 NOTE — ED Notes (Signed)
Ambulated pt. Spo2 dropped to 92% with good pleth, pt reports feeling out of breath.

## 2019-08-02 NOTE — ED Notes (Signed)
All discharge instructions reviewed with pt including pt's diagnosis of COVID. Pt called family member for a ride home. No questions verbalized at this time

## 2019-08-02 NOTE — ED Notes (Signed)
Pt tolerating fluids.   

## 2019-08-02 NOTE — ED Provider Notes (Signed)
[facility] EMERGENCY DEPARTMENT Provider Note   CSN: [id number] Arrival date & time: 08/02/19  1455     History Chief Complaint  Patient presents with  . Cough  . Chest Pain    Joseph Bond is a 34 y.o. male.  HPI  Patient is a 34 year old male with a past medical history of asthma presented today with 3 days of cough, congestion, loss of taste and smell, chest pain with coughing, nausea, vomiting, diarrhea.  Patient states that he has been eating and drinking considerably less and has had no appetite for the last several days as well.  Patient states he feels somewhat short of breath on exertion.  Denies any current chest pain shortness of breath but states that he is also not taken any Tylenol or ibuprofen for the past couple days.  He states he took Tylenol every 6 hours the first day that he was having symptoms but states that it was not helping and he stopped after which he started feeling worse.  He denies any history of DVT, PE, cardiac issues.  He states he has not any had any issues with his asthma in the past and is denies any wheezing.  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  He denies any other issues.     Past Medical History:  Diagnosis Date  . Asthma    childhood asthma    Patient Active Problem List   Diagnosis Date Noted  . Subluxation of patella, acquired, left, initial encounter 02/19/2016  . 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 ADD (attention deficit disorder) 02/19/2016    Past Surgical History:  Procedure Laterality Date  . BELOW KNEE LEG AMPUTATION         History reviewed. No pertinent family history.  Social History   Tobacco Use  . Smoking status: Current Every Day Smoker    Packs/day: 0.50    Types: Cigarettes  . Smokeless tobacco: Current User  Substance Use Topics  . Alcohol use: Yes    Alcohol/week: 3.0 standard drinks    Types: 1 Glasses of wine,  1 Cans of beer, 1 Shots of liquor per week    Comment: social  . Drug use: Yes    Types: "Crack" cocaine, Cocaine    Home Medications Prior to Admission medications   Medication Sig Start Date End Date Taking? Authorizing Provider  bacitracin ointment Apply 1 application topically 2 (two) times daily. Patient not taking: Reported on 08/02/2019 02/11/19   Derwood Kaplan, MD  benzonatate (TESSALON) 100 MG capsule Take 1 capsule (100 mg total) by mouth every 8 (eight) hours. 08/02/19   Gailen Shelter, PA  lidocaine (LIDODERM) 5 % Place 1 patch onto the skin daily. Remove & Discard patch within 12 hours or as directed by MD Patient not taking: Reported on 08/02/2019 09/21/18   Henderly, Britni A, PA-C  ondansetron (ZOFRAN ODT) 4 MG disintegrating tablet Take 1 tablet (4 mg total) by mouth every 8 (eight) hours as needed for nausea or vomiting. 08/02/19   Gailen Shelter, PA    Allergies    Mushroom extract complex and Penicillins  Review of Systems   Review of Systems  Constitutional: Negative for chills and fever.  HENT: Negative for congestion.   Eyes: Negative for pain.  Respiratory: Positive for cough and shortness of breath.   Cardiovascular: Positive for chest pain. Negative for leg swelling.  Gastrointestinal: Negative for abdominal pain and vomiting.  Genitourinary: Negative for dysuria.  Musculoskeletal: Negative for myalgias.  Skin: Negative for rash.  Neurological: Negative for dizziness and headaches.    Physical Exam Updated Vital Signs BP 102/67   Pulse 92   Temp (!) 101.5 F (38.6 C) (Oral)   Resp 20   Ht 5' 6.5" (1.689 m)   Wt 54.4 kg   SpO2 96%   BMI 19.08 kg/m   Physical Exam Vitals and nursing note reviewed.  Constitutional:      General: He is not in acute distress.    Appearance: He is ill-appearing.     Comments: Patient is 34 year old male appears stated age.  Appears very uncomfortable but not in acute distress.  No apparent respiratory distress.    HENT:     Head: Normocephalic and atraumatic.     Nose: Nose normal.  Eyes:     General: No scleral icterus. Cardiovascular:     Rate and Rhythm: Normal rate and regular rhythm.     Pulses: Normal pulses.     Heart sounds: Normal heart sounds.     Comments: Heart rate is tachycardic at rate of 102 Pulmonary:     Effort: Pulmonary effort is normal. No respiratory distress.     Breath sounds: No wheezing.     Comments: Coarse lung sounds diffusely. Speaking in full sentences without difficulty.  No accessory muscle usage. Abdominal:     Palpations: Abdomen is soft.     Tenderness: There is no abdominal tenderness.  Musculoskeletal:     Cervical back: Normal range of motion.     Right lower leg: No edema.     Left lower leg: No edema.  Skin:    General: Skin is warm and dry.     Capillary Refill: Capillary refill takes less than 2 seconds.  Neurological:     Mental Status: He is alert. Mental status is at baseline.  Psychiatric:        Mood and Affect: Mood normal.        Behavior: Behavior normal.     ED Results / Procedures / Treatments   Labs (all labs ordered are listed, but only abnormal results are displayed) Labs Reviewed  SARS CORONAVIRUS 2 BY RT PCR (HOSPITAL ORDER, PERFORMED IN [facility]) - Abnormal; Notable for the following components:      Result Value   SARS Coronavirus 2 POSITIVE (*)    All other components within normal limits  BASIC METABOLIC PANEL - Abnormal; Notable for the following components:   Chloride 94 (*)    Anion gap 21 (*)    All other components within normal limits  URINALYSIS, ROUTINE W REFLEX MICROSCOPIC - Abnormal; Notable for the following components:   Color, Urine AMBER (*)    APPearance HAZY (*)    Ketones, ur 80 (*)    Protein, ur 100 (*)    All other components within normal limits  CBC WITH DIFFERENTIAL/PLATELET  CK  TROPONIN I (HIGH SENSITIVITY)    EKG EKG Interpretation  Date/Time:  Thursday August 02 2019 15:03:38 EDT Ventricular Rate:  103 PR Interval:    QRS Duration: 77 QT Interval:  335 QTC Calculation: 439 R Axis:   92 Text Interpretation: Sinus tachycardia Consider right atrial enlargement Borderline right axis deviation ST elev, probable normal early repol pattern No significant change since last tracing Confirmed by Alvira Monday ([id number]) on 08/02/2019 8:04:27 PM   Radiology DG Chest Portable  1 View  Result Date: 08/02/2019 CLINICAL DATA:  Shortness of breath EXAM: PORTABLE CHEST 1 VIEW COMPARISON:  04/29/2018 FINDINGS: The heart size and mediastinal contours are within normal limits. Patchy airspace opacities within the bilateral perihilar and bibasilar regions. No pleural effusion or pneumothorax. The visualized skeletal structures are unremarkable. IMPRESSION: Patchy bilateral perihilar and bibasilar airspace opacities compatible with multifocal pneumonia in the appropriate clinical setting. Electronically Signed   By: Duanne Guess D.O.   On: 08/02/2019 17:16    Procedures Procedures (including critical care time)  Medications Ordered in ED Medications  ondansetron (ZOFRAN) injection 4 mg (4 mg Intravenous Given 08/02/19 1640)  ibuprofen (ADVIL) tablet 600 mg (600 mg Oral Given 08/02/19 1640)  acetaminophen (TYLENOL) tablet 1,000 mg (1,000 mg Oral Given 08/02/19 1640)  benzonatate (TESSALON) capsule 200 mg (200 mg Oral Given 08/02/19 1737)  chlorpheniramine-HYDROcodone (TUSSIONEX) 10-8 MG/5ML suspension 5 mL (5 mLs Oral Given 08/02/19 1738)    ED Course  I have reviewed the triage vital signs and the nursing notes.  Pertinent labs & imaging results that were available during my care of the patient were reviewed by me and considered in my medical decision making (see chart for details).  Patient is 34 year old male with symptoms as described above.  Patient is somewhat tachycardic, is febrile, is not hypoxic.  Will obtain rapid Covid swab as I suspect that this is causing  his symptoms.  He is also complains of dark urine we will check CK to make sure that he is not in rhabdomyolysis.  We will also check troponin to rule out myocarditis.  Low suspicion for pericarditis/myocarditis given his presentation history and physical exam.  No evidence of tamponade.  Doubt ACS or PE.   Clinical Course as of Aug 02 2043  Thu Aug 02, 2019  1624 EKG normal axis, normal R wave progression, sinus tachycardia, no significant ST-T wave changes or evidence of ischemia.   [WF]  2029 Urinalysis shows ketones and protein consistent with severe dehydration and starvation ketosis.  BMP with mild anion gap of 21 this is consistent with starvation ketosis.  CK within normal limits, CBC without leukocytosis or anemia no leukopenia either.  Covid test positive.  Troponin within normal limits X1 no indication for recheck.  Low suspicion for myocarditis.   [WF]  2030 Chest x-ray read myself.  This does show findings consistent with multifocal Covid pneumonia.  Patient is having some borderline low SPO2 readings when ambulatory but is not overly dyspneic.   [WF]  2030   Patient is having some borderline   [WF]  2030 Tachycardia resolved.  Blood pressures within normal limits.  SPO2 within normal limits at rest.  Respiratory rate is normal.   [WF]  2030 I discussed this case with my attending physician who cosigned this note including patient's presenting symptoms, physical exam, and planned diagnostics and interventions. Attending physician stated agreement with plan or made changes to plan which were implemented.    [WF]    Clinical Course User Index [WF] Gailen Shelter, [place]   I reassessed patient after he received cough medication, Tylenol, ibuprofen, Zofran and small amount of fluids.  He is feeling significantly better and is sleeping in the room on my evaluation.  He was ambulatory without significant dyspnea although he did have oxygen saturation of 92% when ambulating.  Patient is  comfortable discharge home at this time.  He was given conservative therapy recommendations.  I discussed case my attending physician who agrees my  plan.  Joseph Bond was evaluated in Emergency Department on 08/02/2019 for the symptoms described in the history of present illness. He was evaluated in the context of the global COVID-19 pandemic, which necessitated consideration that the patient might be at risk for infection with the SARS-CoV-2 virus that causes COVID-19. Institutional protocols and algorithms that pertain to the evaluation of patients at risk for COVID-19 are in a state of rapid change based on information released by regulatory bodies including the CDC and federal and state organizations. These policies and algorithms were followed during the patient's care in the ED.  Patient given strict return precautions.  He is tolerating p.o. at this time prescribe benzonatate and Zofran.  He will use Tylenol ibuprofen and zinc and vitamin C in the home.  MDM Rules/Calculators/A&P                      Final Clinical Impression(s) / ED Diagnoses Final diagnoses:  Pneumonia due to COVID-19 virus  Cough    Rx / DC Orders ED Discharge Orders         Ordered    ondansetron (ZOFRAN ODT) 4 MG disintegrating tablet  Every 8 hours PRN     08/02/19 2038    benzonatate (TESSALON) 100 MG capsule  Every 8 hours     08/02/19 2038           Tedd Sias, [place] 08/02/19 2045    Gareth Morgan, MD 08/03/19 1328

## 2019-08-02 NOTE — ED Triage Notes (Signed)
Pt BIB [facility] from home following 3 days cough, loss of taste, chest wall pain, N/V/D. Pt travelled to [place], returned yesterday. Hx birth defects, L leg amputation. Pt 98% RA, placed on 2 L [place] for comfort. VS stable. Temp 100.5. Productive cough at present.  134/86 HR 88 100% RR20

## 2019-08-02 NOTE — Discharge Instructions (Addendum)
Your Covid test was positive.  I expect you to make a full recovery however you should monitor your symptoms In the meantime follow CDC guidelines and quarantine, wear a mask, wash hands often.   Please use Tylenol or ibuprofen for pain.  You may use 600 mg ibuprofen every 6 hours or 1000 mg of Tylenol every 6 hours.  You may choose to alternate between the 2.  This would be most effective.  Not to exceed 4 g of Tylenol within 24 hours.  Not to exceed 3200 mg ibuprofen 24 hours.   Please take over the counter vitamin D 2000-4000 units per day. I also recommend zinc 50 mg per day for the next two weeks.   Please return to ED if you feel have difficulty breathing or have emergent, new or concerning symptoms.  Patients who have symptoms consistent with COVID-19 should self isolated for: At least 3 days (72 hours) have passed since recovery, defined as resolution of fever without the use of fever reducing medications and improvement in respiratory symptoms (e.g., cough, shortness of breath), and At least 7 days have passed since symptoms first appeared.       Person Under Monitoring Name: Joseph Bond  Location: [address] [place] [address]   Infection Prevention Recommendations for Individuals Confirmed to have, or Being Evaluated for, 2019 Novel Coronavirus (COVID-19) Infection Who Receive Care at Home  Individuals who are confirmed to have, or are being evaluated for, COVID-19 should follow the prevention steps below until a healthcare provider or local or state health department says they can return to normal activities.  Stay home except to get medical care You should restrict activities outside your home, except for getting medical care. Do not go to work, school, or public areas, and do not use public transportation or taxis.  Call ahead before visiting your doctor Before your medical appointment, call the healthcare provider and tell them that you have, or are being  evaluated for, COVID-19 infection. This will help the healthcare provider's office take steps to keep other people from getting infected. Ask your healthcare provider to call the local or state health department.  Monitor your symptoms Seek prompt medical attention if your illness is worsening (e.g., difficulty breathing). Before going to your medical appointment, call the healthcare provider and tell them that you have, or are being evaluated for, COVID-19 infection. Ask your healthcare provider to call the local or state health department.  Wear a facemask You should wear a facemask that covers your nose and mouth when you are in the same room with other people and when you visit a healthcare provider. People who live with or visit you should also wear a facemask while they are in the same room with you.  Separate yourself from other people in your home As much as possible, you should stay in a different room from other people in your home. Also, you should use a separate bathroom, if available.  Avoid sharing household items You should not share dishes, drinking glasses, cups, eating utensils, towels, bedding, or other items with other people in your home. After using these items, you should wash them thoroughly with soap and water.  Cover your coughs and sneezes Cover your mouth and nose with a tissue when you cough or sneeze, or you can cough or sneeze into your sleeve. Throw used tissues in a lined trash can, and immediately wash your hands with soap and water for at least 20 seconds or use  an alcohol-based hand rub.  Wash your [employer] your hands often and thoroughly with soap and water for at least 20 seconds. You can use an alcohol-based hand sanitizer if soap and water are not available and if your hands are not visibly dirty. Avoid touching your eyes, nose, and mouth with unwashed hands.   Prevention Steps for Caregivers and Household Members of Individuals Confirmed to  have, or Being Evaluated for, COVID-19 Infection Being Cared for in the Home  If you live with, or provide care at home for, a person confirmed to have, or being evaluated for, COVID-19 infection please follow these guidelines to prevent infection:  Follow healthcare provider's instructions Make sure that you understand and can help the patient follow any healthcare provider instructions for all care.  Provide for the patient's basic needs You should help the patient with basic needs in the home and provide support for getting groceries, prescriptions, and other personal needs.  Monitor the patient's symptoms If they are getting sicker, call his or her medical provider and tell them that the patient has, or is being evaluated for, COVID-19 infection. This will help the healthcare provider's office take steps to keep other people from getting infected. Ask the healthcare provider to call the local or state health department.  Limit the number of people who have contact with the patient If possible, have only one caregiver for the patient. Other household members should stay in another home or place of residence. If this is not possible, they should stay in another room, or be separated from the patient as much as possible. Use a separate bathroom, if available. Restrict visitors who do not have an essential need to be in the home.  Keep older adults, very young children, and other sick people away from the patient Keep older adults, very young children, and those who have compromised immune systems or chronic health conditions away from the patient. This includes people with chronic heart, lung, or kidney conditions, diabetes, and cancer.  Ensure good ventilation Make sure that shared spaces in the home have good air flow, such as from an air conditioner or an opened window, weather permitting.  Wash your hands often Wash your hands often and thoroughly with soap and water for at least  20 seconds. You can use an alcohol based hand sanitizer if soap and water are not available and if your hands are not visibly dirty. Avoid touching your eyes, nose, and mouth with unwashed hands. Use disposable paper towels to dry your hands. If not available, use dedicated cloth towels and replace them when they become wet.  Wear a facemask and gloves Wear a disposable facemask at all times in the room and gloves when you touch or have contact with the patient's blood, body fluids, and/or secretions or excretions, such as sweat, saliva, sputum, nasal mucus, vomit, urine, or feces.  Ensure the mask fits over your nose and mouth tightly, and do not touch it during use. Throw out disposable facemasks and gloves after using them. Do not reuse. Wash your hands immediately after removing your facemask and gloves. If your personal clothing becomes contaminated, carefully remove clothing and launder. Wash your hands after handling contaminated clothing. Place all used disposable facemasks, gloves, and other waste in a lined container before disposing them with other household waste. Remove gloves and wash your hands immediately after handling these items.  Do not share dishes, glasses, or other household items with the patient Avoid sharing household items. You  should not share dishes, drinking glasses, cups, eating utensils, towels, bedding, or other items with a patient who is confirmed to have, or being evaluated for, COVID-19 infection. After the person uses these items, you should wash them thoroughly with soap and water.  Wash laundry thoroughly Immediately remove and wash clothes or bedding that have blood, body fluids, and/or secretions or excretions, such as sweat, saliva, sputum, nasal mucus, vomit, urine, or feces, on them. Wear gloves when handling laundry from the patient. Read and follow directions on labels of laundry or clothing items and detergent. In general, wash and dry with the  warmest temperatures recommended on the label.  Clean all areas the individual has used often Clean all touchable surfaces, such as counters, tabletops, doorknobs, bathroom fixtures, toilets, phones, keyboards, tablets, and bedside tables, every day. Also, clean any surfaces that may have blood, body fluids, and/or secretions or excretions on them. Wear gloves when cleaning surfaces the patient has come in contact with. Use a diluted bleach solution (e.g., dilute bleach with 1 part bleach and 10 parts water) or a household disinfectant with a label that says EPA-registered for coronaviruses. To make a bleach solution at home, add 1 tablespoon of bleach to 1 quart (4 cups) of water. For a larger supply, add  cup of bleach to 1 gallon (16 cups) of water. Read labels of cleaning products and follow recommendations provided on product labels. Labels contain instructions for safe and effective use of the cleaning product including precautions you should take when applying the product, such as wearing gloves or eye protection and making sure you have good ventilation during use of the product. Remove gloves and wash hands immediately after cleaning.  Monitor yourself for signs and symptoms of illness Caregivers and household members are considered close contacts, should monitor their health, and will be asked to limit movement outside of the home to the extent possible. Follow the monitoring steps for close contacts listed on the symptom monitoring form.   ? If you have additional questions, contact your local health department or call the epidemiologist on call at [phone] (available 24/7). ? This guidance is subject to change. For the most up-to-date guidance from [facility], please refer to their website: [email or web address]

## 2019-08-03 ENCOUNTER — Other Ambulatory Visit: Payer: Self-pay | Admitting: Unknown Physician Specialty

## 2019-08-03 ENCOUNTER — Telehealth: Payer: Self-pay | Admitting: Unknown Physician Specialty

## 2019-08-03 ENCOUNTER — Telehealth: Payer: Self-pay | Admitting: Critical Care Medicine

## 2019-08-03 DIAGNOSIS — J45901 Unspecified asthma with (acute) exacerbation: Secondary | ICD-10-CM

## 2019-08-03 DIAGNOSIS — U071 COVID-19: Secondary | ICD-10-CM

## 2019-08-03 MED ORDER — ZINC GLUCONATE 50 MG PO TABS
50.0000 mg | ORAL_TABLET | Freq: Every day | ORAL | 0 refills | Status: AC
Start: 2019-08-03 — End: ?

## 2019-08-03 MED ORDER — AZITHROMYCIN 250 MG PO TABS: ORAL_TABLET | ORAL | 0 refills | Status: AC

## 2019-08-03 MED ORDER — VITAMIN D 125 MCG (5000 UT) PO CAPS: 1.0000 | ORAL_CAPSULE | Freq: Every day | ORAL | 0 refills | Status: AC

## 2019-08-03 MED ORDER — PROMETHAZINE-DM 6.25-15 MG/5ML PO SYRP: 5.0000 mL | ORAL_SOLUTION | Freq: Four times a day (QID) | ORAL | 0 refills | Status: AC | PRN

## 2019-08-03 MED ORDER — VITAMIN C 250 MG PO TABS: 500.0000 mg | ORAL_TABLET | Freq: Every day | ORAL | 0 refills | Status: AC

## 2019-08-03 MED ORDER — SODIUM CHLORIDE 0.9 % IV SOLN
Freq: Once | INTRAVENOUS | Status: AC
  Filled 2019-08-03: qty 700

## 2019-08-03 MED ORDER — PREDNISONE 10 MG PO TABS
ORAL_TABLET | ORAL | 0 refills | Status: AC
Start: 2019-08-03 — End: ?

## 2019-08-03 NOTE — Telephone Encounter (Signed)
I connected with this patient who has Covid infection and is going to receive monoclonal antibody infusion tomorrow at 10:30 AM in the infusion center  The APP who spoke to the patient was concerned over the patient's status and I connected with the patient.  The patient is short of breath and coughing has muscle aches fever.  He is not acutely short of breath.  I plan to send the patient in a prescription for prednisone and azithromycin along with cough suppressant and albuterol vitamin C vitamin D and zinc  I told the patient if his symptoms worsen he should go to the emergency room for evaluation he knows and understands this  He apparently went to a graduation ceremony in [place] recently and all his other family were vaccinated but he was not and he may have been exposed during those events  He is well within the timeframe for receiving a monoclonal antibody infusion which will occur on June 5  He was given directions as to where to go for the infusion

## 2019-08-03 NOTE — Telephone Encounter (Signed)
  I connected by phone with Joseph Bond on 08/03/2019 at 7:48 AM to discuss the potential use of an new treatment for mild to moderate COVID-19 viral infection in non-hospitalized patients.  This patient is a 34 y.o. male that meets the FDA criteria for Emergency Use Authorization of bamlanivimab/etesevimab or casirivimab/imdevimab.  Has a (+) direct SARS-CoV-2 viral test result  Has mild or moderate COVID-19   Is NOT hospitalized due to COVID-19  Is within 10 days of symptom onset  Has at least one of the high risk factor(s) for progression to severe COVID-19 and/or hospitalization as defined in EUA. Specific high risk criteria :Asthma and member of a high risk group  I have spoken and communicated the following to the patient or parent/caregiver:  1. FDA has authorized the emergency use of bamlanivimab/etesevimab and casirivimab\imdevimab for the treatment of mild to moderate COVID-19 in adults and pediatric patients with positive results of direct SARS-CoV-2 viral testing who are 65 years of age and older weighing at least 40 kg, and who are at high risk for progressing to severe COVID-19 and/or hospitalization.  2. The significant known and potential risks and benefits of bamlanivimab/etesevimab and casirivimab\imdevimab, and the extent to which such potential risks and benefits are unknown.  3. Information on available alternative treatments and the risks and benefits of those alternatives, including clinical trials.  4. Patients treated with bamlanivimab/etesevimab and casirivimab\imdevimab should continue to self-isolate and use infection control measures (e.g., wear mask, isolate, social distance, avoid sharing personal items, clean and disinfect "high touch" surfaces, and frequent handwashing) according to CDC guidelines.   5. The patient or parent/caregiver has the option to accept or refuse bamlanivimab/etesevimab or casirivimab\imdevimab .  After reviewing this information with  the patient, The patient agreed to proceed with receiving the bamlanimivab infusion and will be provided a copy of the Fact sheet prior to receiving the infusion.[name] 08/03/2019 7:48 AM Sx onset 6/01

## 2019-08-04 ENCOUNTER — Ambulatory Visit (HOSPITAL_COMMUNITY)
Admission: RE | Admit: 2019-08-04 | Discharge: 2019-08-04 | Disposition: A | Discharge status: Home / Self Care | Payer: Medicaid Other | Source: Ambulatory Visit | Attending: Pulmonary Disease | Admitting: Pulmonary Disease

## 2019-08-04 DIAGNOSIS — U071 COVID-19: Secondary | ICD-10-CM | POA: Insufficient documentation

## 2019-08-04 DIAGNOSIS — J45901 Unspecified asthma with (acute) exacerbation: Secondary | ICD-10-CM | POA: Insufficient documentation

## 2019-08-04 NOTE — Progress Notes (Signed)
  Diagnosis: COVID-19  Physician: Dr. Delford Field   Procedure: [facility] Med: bamlanivimab\etesevimab infusion - Provided patient with bamlanimivab\etesevimab fact sheet for patients, parents and caregivers prior to infusion.  Complications: No immediate complications noted.  Discharge: Discharged home   Joseph Bond 08/04/2019

## 2019-08-04 NOTE — Discharge Instructions (Signed)

## 2020-10-22 IMAGING — CR DG HAND COMPLETE 3+V*R*
3 series · 3 of 3 positions shown · non-contrast
Comparison: None.

CLINICAL DATA: Patient reports to the ED with c/o laceration to
anterior base of right thumb. Patient reports he cut it with broken
glass trying to open a can.

EXAM:
RIGHT HAND - COMPLETE 3+ VIEW

[x hand pa right]
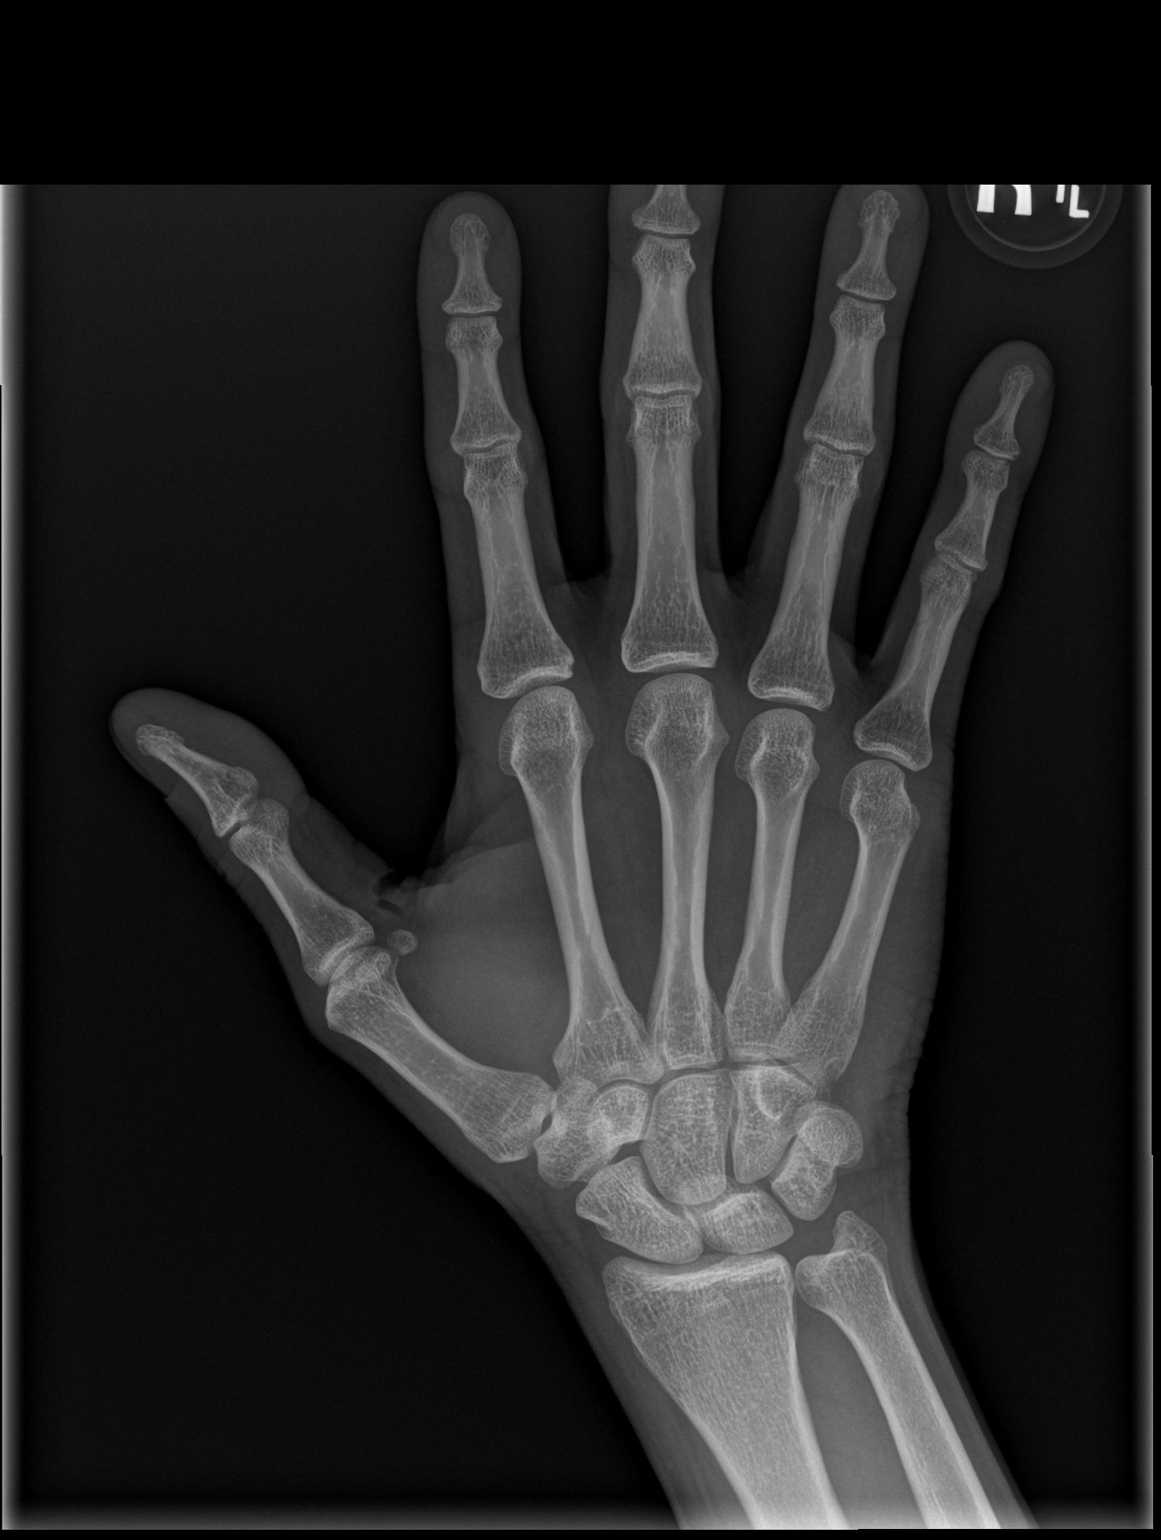

[x hand obl right]
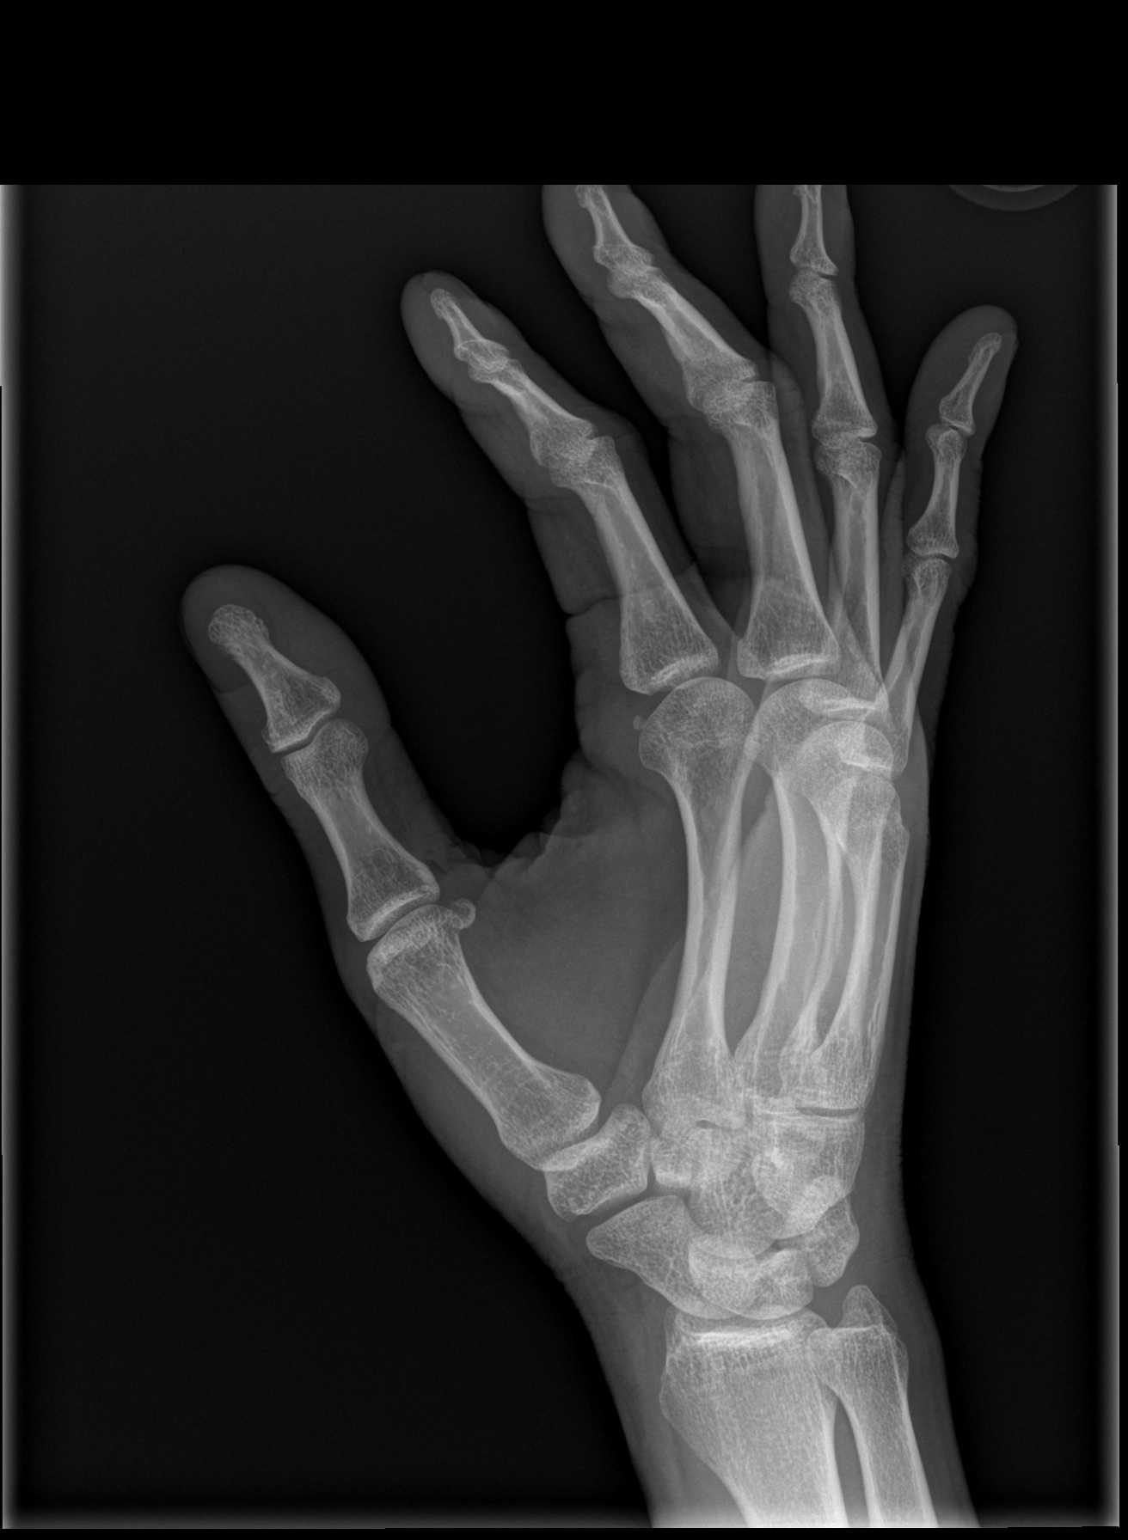

[x hand lat right]
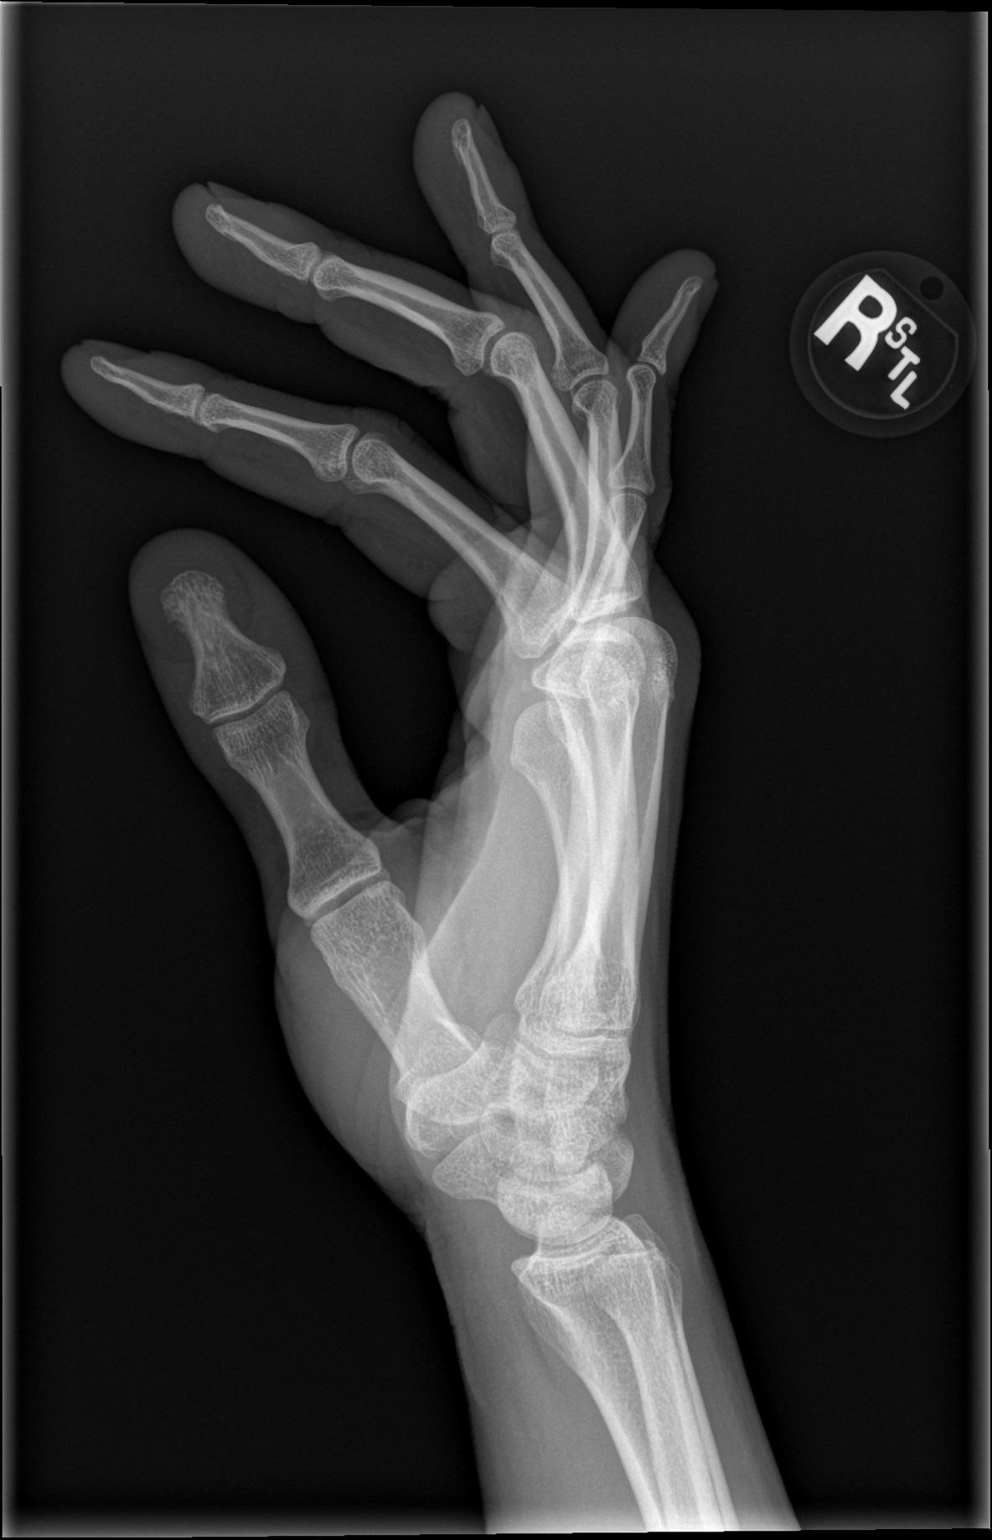

[3 of 3 positions shown; findings below may reference images not displayed]

FINDINGS: There is no evidence of fracture or dislocation. There is no
evidence of arthropathy or other focal bone abnormality. Soft
tissues defect at the base of the right thumb without a radiopaque
foreign body.
IMPRESSION: No acute bony abnormality. No radiopaque foreign body is evident.

## 2021-01-19 ENCOUNTER — Encounter: Payer: Medicaid Other | Admitting: Orthopedic Surgery

## 2021-04-12 IMAGING — DX DG CHEST 1V PORT
1 series · 1 of 1 positions shown · non-contrast
Comparison: 04/29/2018

CLINICAL DATA: Shortness of breath

EXAM:
PORTABLE CHEST 1 VIEW

[chest]
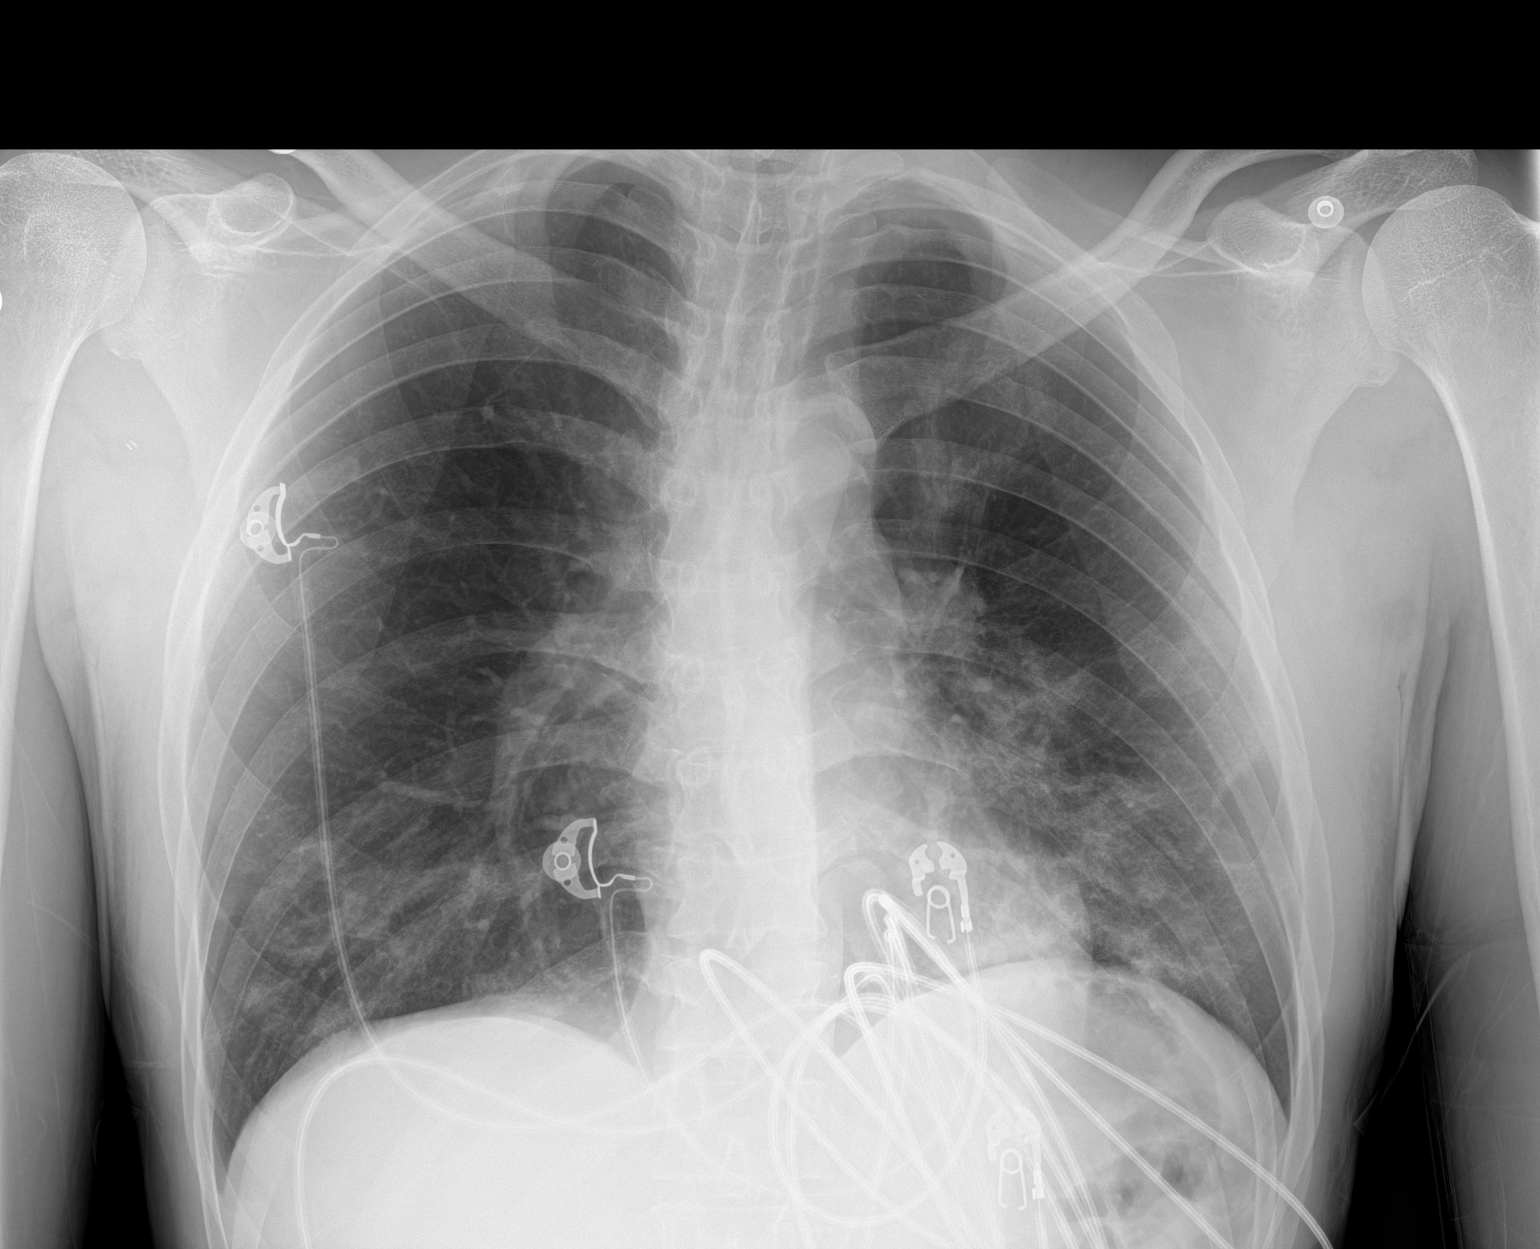

[1 of 1 positions shown; findings below may reference images not displayed]

FINDINGS: The heart size and mediastinal contours are within normal limits.
Patchy airspace opacities within the bilateral perihilar and
bibasilar regions. No pleural effusion or pneumothorax. The
visualized skeletal structures are unremarkable.
IMPRESSION: Patchy bilateral perihilar and bibasilar airspace opacities
compatible with multifocal pneumonia in the appropriate clinical
setting.
